# Patient Record
Sex: Female | Born: 1955 | Race: Black or African American | Hispanic: No | Marital: Married | Smoking: Never smoker
Health system: Southern US, Community
[De-identification: ages and names within clinical notes are randomized; demographics above are authoritative.]

## PROBLEM LIST (undated history)

## (undated) DIAGNOSIS — E785 Hyperlipidemia, unspecified: Secondary | ICD-10-CM

## (undated) HISTORY — PX: ABDOMINAL HYSTERECTOMY: SHX81

## (undated) HISTORY — PX: APPENDECTOMY: SHX54

## (undated) HISTORY — DX: Hyperlipidemia, unspecified: E78.5

---

## 1999-11-20 ENCOUNTER — Other Ambulatory Visit: Admission: RE | Admit: 1999-11-20 | Discharge: 1999-11-20 | Payer: Self-pay | Admitting: Obstetrics and Gynecology

## 2000-12-17 ENCOUNTER — Other Ambulatory Visit: Admission: RE | Admit: 2000-12-17 | Discharge: 2000-12-17 | Payer: Self-pay | Admitting: Obstetrics and Gynecology

## 2002-01-04 ENCOUNTER — Other Ambulatory Visit: Admission: RE | Admit: 2002-01-04 | Discharge: 2002-01-04 | Payer: Self-pay | Admitting: Obstetrics and Gynecology

## 2003-02-13 ENCOUNTER — Other Ambulatory Visit: Admission: RE | Admit: 2003-02-13 | Discharge: 2003-02-13 | Payer: Self-pay | Admitting: Obstetrics and Gynecology

## 2004-04-05 ENCOUNTER — Other Ambulatory Visit: Admission: RE | Admit: 2004-04-05 | Discharge: 2004-04-05 | Payer: Self-pay | Admitting: Obstetrics and Gynecology

## 2005-05-15 ENCOUNTER — Other Ambulatory Visit: Admission: RE | Admit: 2005-05-15 | Discharge: 2005-05-15 | Payer: Self-pay | Admitting: Obstetrics and Gynecology

## 2010-03-22 ENCOUNTER — Ambulatory Visit (HOSPITAL_COMMUNITY): Admission: RE | Admit: 2010-03-22 | Discharge: 2010-03-22 | Payer: Self-pay | Admitting: Obstetrics and Gynecology

## 2011-03-17 LAB — CBC
HCT: 40.2 % (ref 36.0–46.0)
Hemoglobin: 13.4 g/dL (ref 12.0–15.0)
MCHC: 33.3 g/dL (ref 30.0–36.0)
MCV: 94.1 fL (ref 78.0–100.0)
RDW: 13.9 % (ref 11.5–15.5)

## 2012-01-23 DIAGNOSIS — K603 Anal fistula: Secondary | ICD-10-CM | POA: Insufficient documentation

## 2012-01-23 DIAGNOSIS — K649 Unspecified hemorrhoids: Secondary | ICD-10-CM | POA: Insufficient documentation

## 2012-10-14 ENCOUNTER — Other Ambulatory Visit: Payer: Self-pay | Admitting: Obstetrics and Gynecology

## 2012-10-14 DIAGNOSIS — Z1231 Encounter for screening mammogram for malignant neoplasm of breast: Secondary | ICD-10-CM

## 2012-11-22 ENCOUNTER — Ambulatory Visit
Admission: RE | Admit: 2012-11-22 | Discharge: 2012-11-22 | Disposition: A | Discharge status: Home / Self Care | Payer: 59 | Source: Ambulatory Visit | Attending: Obstetrics and Gynecology | Admitting: Obstetrics and Gynecology

## 2012-11-22 DIAGNOSIS — Z1231 Encounter for screening mammogram for malignant neoplasm of breast: Secondary | ICD-10-CM

## 2012-11-28 ENCOUNTER — Emergency Department (HOSPITAL_COMMUNITY): Payer: 59

## 2012-11-28 ENCOUNTER — Encounter (HOSPITAL_COMMUNITY): Payer: Self-pay | Admitting: Emergency Medicine

## 2012-11-28 ENCOUNTER — Emergency Department (HOSPITAL_COMMUNITY)
Admission: EM | Admit: 2012-11-28 | Discharge: 2012-11-28 | Disposition: A | Discharge status: Home / Self Care | Payer: 59 | Attending: Emergency Medicine | Admitting: Emergency Medicine

## 2012-11-28 DIAGNOSIS — Y9389 Activity, other specified: Secondary | ICD-10-CM | POA: Insufficient documentation

## 2012-11-28 DIAGNOSIS — Y929 Unspecified place or not applicable: Secondary | ICD-10-CM | POA: Insufficient documentation

## 2012-11-28 DIAGNOSIS — R296 Repeated falls: Secondary | ICD-10-CM | POA: Insufficient documentation

## 2012-11-28 DIAGNOSIS — S20219A Contusion of unspecified front wall of thorax, initial encounter: Secondary | ICD-10-CM | POA: Insufficient documentation

## 2012-11-28 MED ORDER — IBUPROFEN 600 MG PO TABS: 600.0000 mg | ORAL_TABLET | Freq: Four times a day (QID) | ORAL | Status: DC | PRN

## 2012-11-28 MED ORDER — HYDROCODONE-ACETAMINOPHEN 5-325 MG PO TABS: 2.0000 | ORAL_TABLET | ORAL | Status: DC | PRN

## 2012-11-28 NOTE — ED Provider Notes (Signed)
History     CSN: [id number]  Arrival date & time 11/28/12  4098   First MD Initiated Contact with Patient 11/28/12 [phone]      Chief Complaint  Patient presents with  . Shoulder Injury     HPI Patient fell in the shower this morning.  She has chief complaint of pain to her right scapula and rib area associated with the scapula.  She has pain with movement of her shoulder but the pain is in her back and scapular area.  She states the shoulder itself is nontender. History reviewed. No pertinent past medical history.  Past Surgical History  Procedure Date  . Abdominal hysterectomy   . Appendectomy     History reviewed. No pertinent family history.  History  Substance Use Topics  . Smoking status: Never Smoker   . Smokeless tobacco: Not on file  . Alcohol Use: No    OB History    Grav Para Term Preterm Abortions TAB SAB Ect Mult Living                  Review of Systems All other systems reviewed and are negative Allergies  Review of patient's allergies indicates no known allergies.  Home Medications   Current Outpatient Rx  Name  Route  Sig  Dispense  Refill  . HYDROCODONE-ACETAMINOPHEN 5-325 MG PO TABS   Oral   Take 2 tablets by mouth every 4 (four) hours as needed for pain.   10 tablet   0   . IBUPROFEN 600 MG PO TABS   Oral   Take 1 tablet (600 mg total) by mouth every 6 (six) hours as needed for pain.   30 tablet   0     BP 132/70  Pulse 88  Temp 97.7 F (36.5 C) (Oral)  Resp 18  SpO2 100%  Physical Exam  Nursing note and vitals reviewed. Constitutional: She is oriented to person, place, and time. She appears well-developed and well-nourished. No distress.  HENT:  Head: Normocephalic and atraumatic.  Eyes: Pupils are equal, round, and reactive to light.  Neck: Normal range of motion.  Cardiovascular: Normal rate and intact distal pulses.   Pulmonary/Chest: No respiratory distress.  Abdominal: Normal appearance. She exhibits no distension.    Musculoskeletal:       Right shoulder: She exhibits tenderness (As indicated).       Arms: Neurological: She is alert and oriented to person, place, and time. No cranial nerve deficit.  Skin: Skin is warm and dry. No rash noted.  Psychiatric: She has a normal mood and affect. Her behavior is normal.    ED Course  Procedures (including critical care time)   Sling applied    Labs Reviewed - No data to display Dg Ribs Unilateral W/chest Right  11/28/2012  *RADIOLOGY REPORT*  Clinical Data: Fall, posterior shoulder pain  RIGHT RIBS AND CHEST - 3+ VIEW  Comparison: 03/30/2012  Findings: Four views right ribs submitted.  Cardiomediastinal silhouette is stable.  No acute infiltrate or pulmonary edema.  No right rib fracture is identified.  No diagnostic pneumothorax.  IMPRESSION: No acute disease.  No right rib fracture is identified.  No diagnostic pneumothorax.   Original Report Authenticated By: Natasha Mead, M.D.    Dg Scapula Right  11/28/2012  *RADIOLOGY REPORT*  Clinical Data: Fall, shoulder pain  RIGHT SCAPULA - 2+ VIEWS  Comparison: None.  Findings: Three views of the right scapula submitted.  No acute fracture or subluxation.  IMPRESSION:  No acute fracture or subluxation.   Original Report Authenticated By: Natasha Mead, M.D.      1. Rib contusion       MDM         Nelia Shi, MD 11/28/12 [phone]

## 2012-11-28 NOTE — Discharge Instructions (Signed)
Chest Contusion A contusion is a deep bruise. Bruises happen when an injury causes bleeding under the skin. Signs of bruising include pain, puffiness (swelling), and discolored skin. The bruise may turn blue, purple, or yellow. Pay attention to how you are doing. HOME CARE  Put ice on the injured area.  Put ice in a plastic bag.  Place a towel between the skin and the bag.  Leave the ice on for 15 to 20 minutes at a time, 3 to 4 times a day for the first 48 hours.  Rest.  Do not lift anything heavy.  Limit your activity as told by your doctor  Take 3 to 4 deep breaths every hour while awake. Hold your hand or a pillow over the sore area for support.  Breathe from the belly (abdomen).  Breathe in through the nose, as if you are smelling a flower.  Breathe out through the mouth, as if you are blowing out a candle.  Only take medicine as told by your doctor. GET HELP RIGHT AWAY IF:   You have trouble breathing or cough up thick spit (mucus).  You have chest pain that goes into the arms or jaw.  The skin is wet and pale.  You have a fever.  You feel dizzy, weak, or pass out (faint).  You cannot breathe easily.  The bruise is getting worse. MAKE SURE YOU:   Understand these instructions.  Will watch your condition.  Will get help right away if you are not doing well or get worse. Document Released: 05/26/2008 Document Revised: 03/01/2012 Document Reviewed: 05/26/2008 [facility] Patient Information 2013 [place], [place].

## 2012-11-28 NOTE — ED Notes (Signed)
Patient states that she was in the shower and fell from a standing position to right shoulder

## 2013-10-10 ENCOUNTER — Other Ambulatory Visit: Payer: Self-pay

## 2013-10-10 DIAGNOSIS — Z1231 Encounter for screening mammogram for malignant neoplasm of breast: Secondary | ICD-10-CM

## 2013-10-31 DIAGNOSIS — C541 Malignant neoplasm of endometrium: Secondary | ICD-10-CM | POA: Insufficient documentation

## 2013-12-12 ENCOUNTER — Ambulatory Visit
Admission: RE | Admit: 2013-12-12 | Discharge: 2013-12-12 | Disposition: A | Discharge status: Home / Self Care | Payer: 59 | Source: Ambulatory Visit

## 2013-12-12 DIAGNOSIS — Z1231 Encounter for screening mammogram for malignant neoplasm of breast: Secondary | ICD-10-CM

## 2013-12-26 ENCOUNTER — Ambulatory Visit: Payer: 59

## 2014-08-22 DIAGNOSIS — Z800 Family history of malignant neoplasm of digestive organs: Secondary | ICD-10-CM | POA: Insufficient documentation

## 2014-11-01 ENCOUNTER — Other Ambulatory Visit: Payer: Self-pay

## 2014-11-24 ENCOUNTER — Other Ambulatory Visit: Payer: Self-pay

## 2014-11-24 DIAGNOSIS — Z1231 Encounter for screening mammogram for malignant neoplasm of breast: Secondary | ICD-10-CM

## 2014-12-18 ENCOUNTER — Ambulatory Visit
Admission: RE | Admit: 2014-12-18 | Discharge: 2014-12-18 | Disposition: A | Discharge status: Home / Self Care | Payer: 59 | Source: Ambulatory Visit

## 2014-12-18 DIAGNOSIS — Z1231 Encounter for screening mammogram for malignant neoplasm of breast: Secondary | ICD-10-CM

## 2015-10-30 ENCOUNTER — Other Ambulatory Visit: Payer: Self-pay

## 2015-10-30 DIAGNOSIS — Z1231 Encounter for screening mammogram for malignant neoplasm of breast: Secondary | ICD-10-CM

## 2015-12-25 ENCOUNTER — Ambulatory Visit: Payer: Self-pay

## 2015-12-31 ENCOUNTER — Ambulatory Visit: Payer: Self-pay

## 2016-01-09 ENCOUNTER — Ambulatory Visit
Admission: RE | Admit: 2016-01-09 | Discharge: 2016-01-09 | Disposition: A | Discharge status: Home / Self Care | Payer: 59 | Source: Ambulatory Visit

## 2016-01-09 DIAGNOSIS — Z1231 Encounter for screening mammogram for malignant neoplasm of breast: Secondary | ICD-10-CM

## 2016-01-14 ENCOUNTER — Ambulatory Visit: Payer: Self-pay

## 2016-12-08 ENCOUNTER — Other Ambulatory Visit: Payer: Self-pay | Admitting: Obstetrics and Gynecology

## 2016-12-08 DIAGNOSIS — Z1231 Encounter for screening mammogram for malignant neoplasm of breast: Secondary | ICD-10-CM

## 2017-01-09 ENCOUNTER — Ambulatory Visit: Payer: 59

## 2017-01-15 DIAGNOSIS — Z83511 Family history of glaucoma: Secondary | ICD-10-CM | POA: Diagnosis not present

## 2017-01-15 DIAGNOSIS — H40053 Ocular hypertension, bilateral: Secondary | ICD-10-CM | POA: Diagnosis not present

## 2017-01-15 DIAGNOSIS — H40013 Open angle with borderline findings, low risk, bilateral: Secondary | ICD-10-CM | POA: Diagnosis not present

## 2017-01-23 DIAGNOSIS — Z83511 Family history of glaucoma: Secondary | ICD-10-CM | POA: Diagnosis not present

## 2017-01-23 DIAGNOSIS — H40013 Open angle with borderline findings, low risk, bilateral: Secondary | ICD-10-CM | POA: Diagnosis not present

## 2017-01-23 DIAGNOSIS — H40053 Ocular hypertension, bilateral: Secondary | ICD-10-CM | POA: Diagnosis not present

## 2017-01-27 ENCOUNTER — Ambulatory Visit
Admission: RE | Admit: 2017-01-27 | Discharge: 2017-01-27 | Disposition: A | Discharge status: Home / Self Care | Payer: 59 | Source: Ambulatory Visit | Attending: Obstetrics and Gynecology | Admitting: Obstetrics and Gynecology

## 2017-01-27 DIAGNOSIS — Z1231 Encounter for screening mammogram for malignant neoplasm of breast: Secondary | ICD-10-CM | POA: Diagnosis not present

## 2017-05-05 DIAGNOSIS — S80262A Insect bite (nonvenomous), left knee, initial encounter: Secondary | ICD-10-CM | POA: Diagnosis not present

## 2017-05-11 DIAGNOSIS — L03116 Cellulitis of left lower limb: Secondary | ICD-10-CM | POA: Diagnosis not present

## 2017-05-14 DIAGNOSIS — Z01419 Encounter for gynecological examination (general) (routine) without abnormal findings: Secondary | ICD-10-CM | POA: Diagnosis not present

## 2017-07-24 DIAGNOSIS — H40013 Open angle with borderline findings, low risk, bilateral: Secondary | ICD-10-CM | POA: Diagnosis not present

## 2017-09-21 DIAGNOSIS — R21 Rash and other nonspecific skin eruption: Secondary | ICD-10-CM | POA: Diagnosis not present

## 2017-12-25 DIAGNOSIS — H6123 Impacted cerumen, bilateral: Secondary | ICD-10-CM | POA: Diagnosis not present

## 2017-12-25 DIAGNOSIS — J0100 Acute maxillary sinusitis, unspecified: Secondary | ICD-10-CM | POA: Diagnosis not present

## 2018-01-06 ENCOUNTER — Other Ambulatory Visit: Payer: Self-pay | Admitting: Obstetrics and Gynecology

## 2018-01-06 DIAGNOSIS — Z1231 Encounter for screening mammogram for malignant neoplasm of breast: Secondary | ICD-10-CM

## 2018-01-22 DIAGNOSIS — H40013 Open angle with borderline findings, low risk, bilateral: Secondary | ICD-10-CM | POA: Diagnosis not present

## 2018-02-03 ENCOUNTER — Ambulatory Visit
Admission: RE | Admit: 2018-02-03 | Discharge: 2018-02-03 | Disposition: A | Discharge status: Home / Self Care | Payer: 59 | Source: Ambulatory Visit | Attending: Obstetrics and Gynecology | Admitting: Obstetrics and Gynecology

## 2018-02-03 DIAGNOSIS — Z1231 Encounter for screening mammogram for malignant neoplasm of breast: Secondary | ICD-10-CM

## 2018-05-19 DIAGNOSIS — H10812 Pingueculitis, left eye: Secondary | ICD-10-CM | POA: Diagnosis not present

## 2018-05-26 DIAGNOSIS — H10812 Pingueculitis, left eye: Secondary | ICD-10-CM | POA: Diagnosis not present

## 2018-06-03 DIAGNOSIS — Z806 Family history of leukemia: Secondary | ICD-10-CM | POA: Diagnosis not present

## 2018-06-03 DIAGNOSIS — M816 Localized osteoporosis [Lequesne]: Secondary | ICD-10-CM | POA: Diagnosis not present

## 2018-06-03 DIAGNOSIS — N958 Other specified menopausal and perimenopausal disorders: Secondary | ICD-10-CM | POA: Diagnosis not present

## 2018-06-03 DIAGNOSIS — Z01419 Encounter for gynecological examination (general) (routine) without abnormal findings: Secondary | ICD-10-CM | POA: Diagnosis not present

## 2018-06-03 DIAGNOSIS — Z8542 Personal history of malignant neoplasm of other parts of uterus: Secondary | ICD-10-CM | POA: Diagnosis not present

## 2018-06-03 DIAGNOSIS — Z800 Family history of malignant neoplasm of digestive organs: Secondary | ICD-10-CM | POA: Diagnosis not present

## 2018-06-03 DIAGNOSIS — Z13820 Encounter for screening for osteoporosis: Secondary | ICD-10-CM | POA: Diagnosis not present

## 2018-07-15 DIAGNOSIS — Z809 Family history of malignant neoplasm, unspecified: Secondary | ICD-10-CM | POA: Diagnosis not present

## 2018-07-15 DIAGNOSIS — M818 Other osteoporosis without current pathological fracture: Secondary | ICD-10-CM | POA: Diagnosis not present

## 2018-07-16 DIAGNOSIS — H40013 Open angle with borderline findings, low risk, bilateral: Secondary | ICD-10-CM | POA: Diagnosis not present

## 2018-12-28 DIAGNOSIS — M7671 Peroneal tendinitis, right leg: Secondary | ICD-10-CM | POA: Diagnosis not present

## 2018-12-28 DIAGNOSIS — M79671 Pain in right foot: Secondary | ICD-10-CM | POA: Diagnosis not present

## 2018-12-28 DIAGNOSIS — M7062 Trochanteric bursitis, left hip: Secondary | ICD-10-CM | POA: Diagnosis not present

## 2018-12-30 ENCOUNTER — Other Ambulatory Visit: Payer: Self-pay | Admitting: Obstetrics and Gynecology

## 2018-12-30 DIAGNOSIS — Z1231 Encounter for screening mammogram for malignant neoplasm of breast: Secondary | ICD-10-CM

## 2019-01-17 DIAGNOSIS — J0100 Acute maxillary sinusitis, unspecified: Secondary | ICD-10-CM | POA: Diagnosis not present

## 2019-01-28 DIAGNOSIS — H40013 Open angle with borderline findings, low risk, bilateral: Secondary | ICD-10-CM | POA: Diagnosis not present

## 2019-02-04 ENCOUNTER — Ambulatory Visit
Admission: RE | Admit: 2019-02-04 | Discharge: 2019-02-04 | Disposition: A | Discharge status: Home / Self Care | Payer: 59 | Source: Ambulatory Visit | Attending: Obstetrics and Gynecology | Admitting: Obstetrics and Gynecology

## 2019-02-04 DIAGNOSIS — M79671 Pain in right foot: Secondary | ICD-10-CM | POA: Diagnosis not present

## 2019-02-04 DIAGNOSIS — Z1231 Encounter for screening mammogram for malignant neoplasm of breast: Secondary | ICD-10-CM | POA: Diagnosis not present

## 2019-02-04 DIAGNOSIS — M25552 Pain in left hip: Secondary | ICD-10-CM | POA: Diagnosis not present

## 2019-02-09 DIAGNOSIS — M7062 Trochanteric bursitis, left hip: Secondary | ICD-10-CM | POA: Diagnosis not present

## 2019-02-14 DIAGNOSIS — M7062 Trochanteric bursitis, left hip: Secondary | ICD-10-CM | POA: Diagnosis not present

## 2019-02-16 DIAGNOSIS — M7062 Trochanteric bursitis, left hip: Secondary | ICD-10-CM | POA: Diagnosis not present

## 2019-02-21 DIAGNOSIS — M7062 Trochanteric bursitis, left hip: Secondary | ICD-10-CM | POA: Diagnosis not present

## 2019-02-23 DIAGNOSIS — M7062 Trochanteric bursitis, left hip: Secondary | ICD-10-CM | POA: Diagnosis not present

## 2019-03-02 DIAGNOSIS — M7062 Trochanteric bursitis, left hip: Secondary | ICD-10-CM | POA: Diagnosis not present

## 2019-03-04 DIAGNOSIS — M7062 Trochanteric bursitis, left hip: Secondary | ICD-10-CM | POA: Diagnosis not present

## 2019-03-07 DIAGNOSIS — M7062 Trochanteric bursitis, left hip: Secondary | ICD-10-CM | POA: Diagnosis not present

## 2019-03-10 DIAGNOSIS — M7062 Trochanteric bursitis, left hip: Secondary | ICD-10-CM | POA: Diagnosis not present

## 2019-03-17 DIAGNOSIS — M25552 Pain in left hip: Secondary | ICD-10-CM | POA: Diagnosis not present

## 2019-08-01 DIAGNOSIS — M810 Age-related osteoporosis without current pathological fracture: Secondary | ICD-10-CM | POA: Insufficient documentation

## 2019-12-28 ENCOUNTER — Other Ambulatory Visit: Payer: Self-pay | Admitting: Obstetrics and Gynecology

## 2019-12-28 DIAGNOSIS — Z1231 Encounter for screening mammogram for malignant neoplasm of breast: Secondary | ICD-10-CM

## 2020-01-24 IMAGING — MG DIGITAL SCREENING BILATERAL MAMMOGRAM WITH TOMO AND CAD
8 series · 8 of 24 positions shown · non-contrast
Comparison: Previous exam(s).

CLINICAL DATA: Screening.

EXAM:
DIGITAL SCREENING BILATERAL MAMMOGRAM WITH TOMO AND CAD

[L MLO synth-2D]
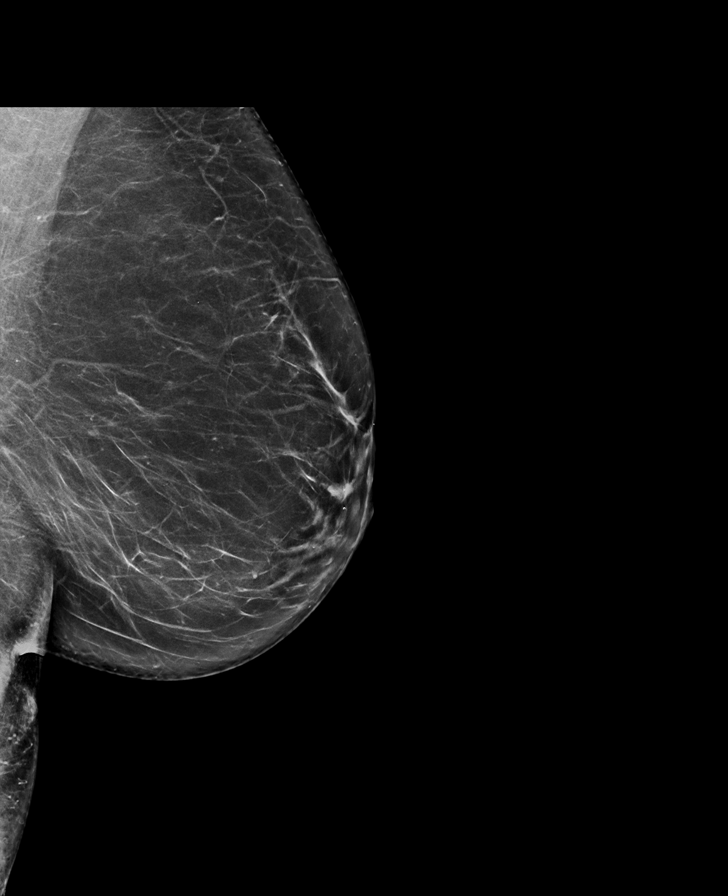

[L CC synth-2D]
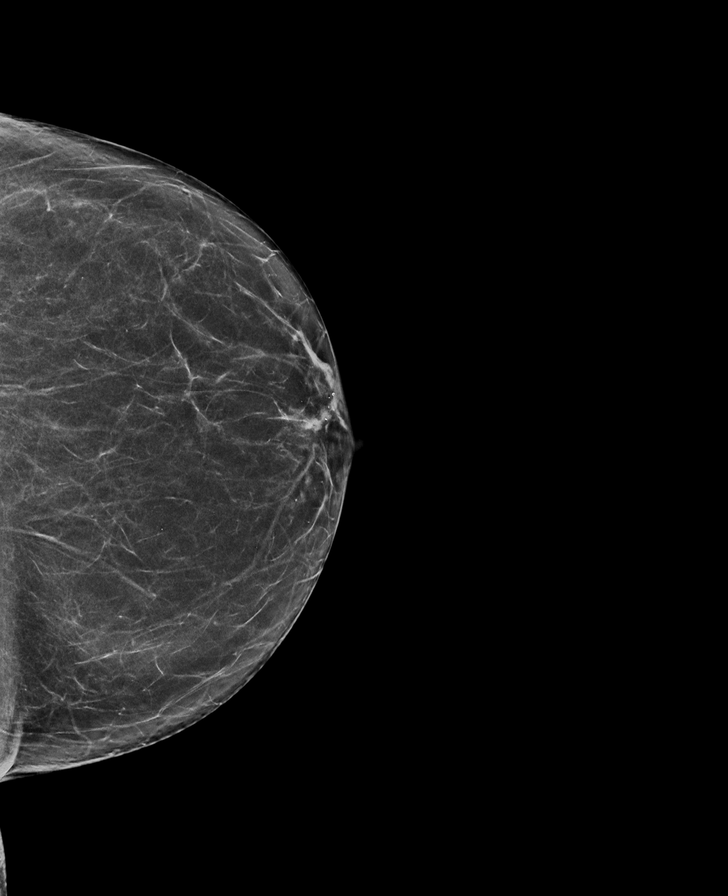

[R MLO synth-2D]
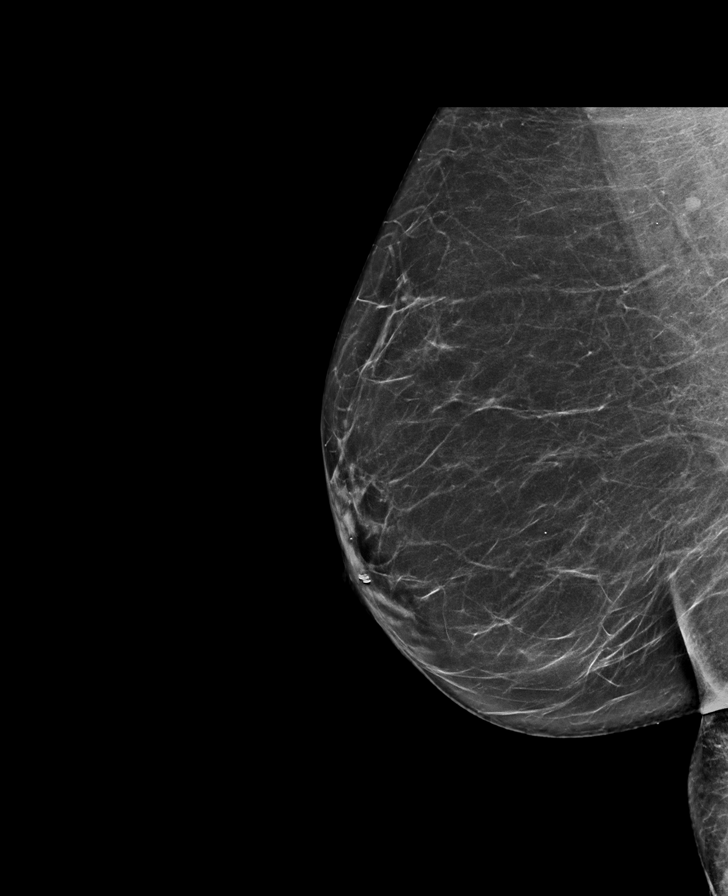

[R CC synth-2D]
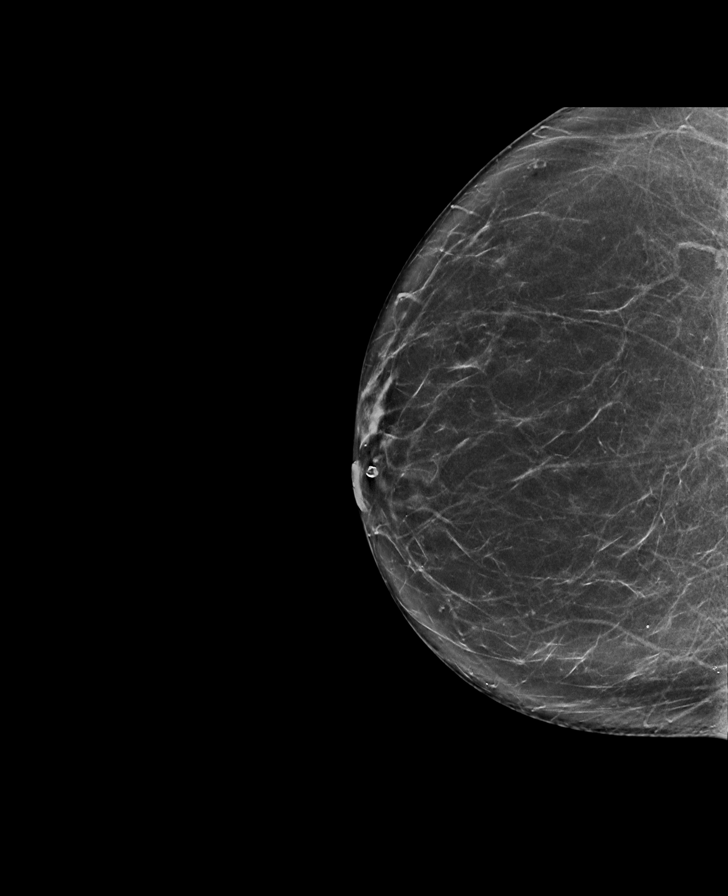

[R CC tomo · tomo slice 40/79.0]
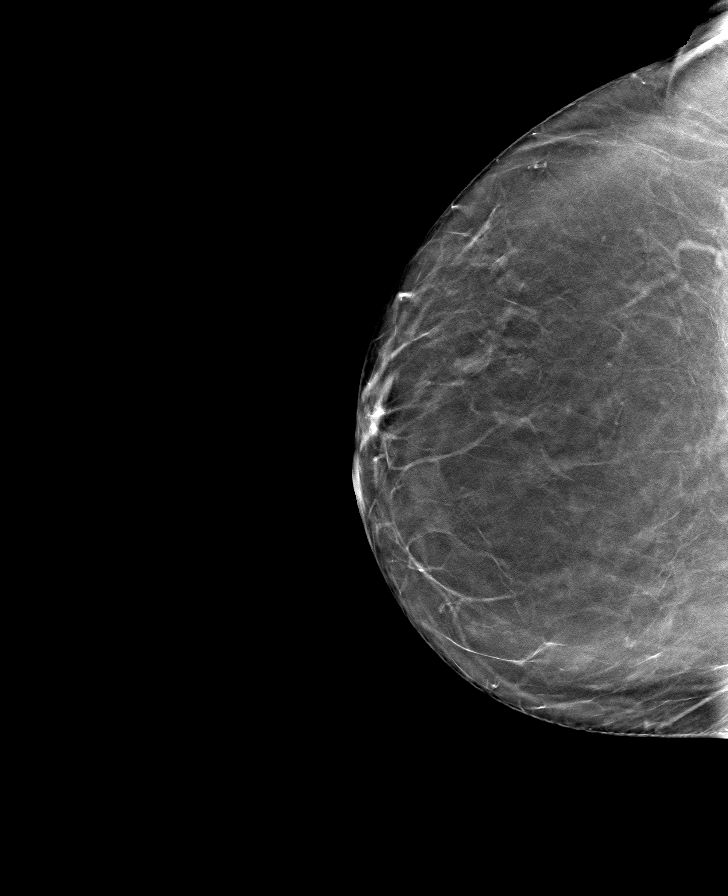

[L MLO tomo · tomo slice 40/79.0]
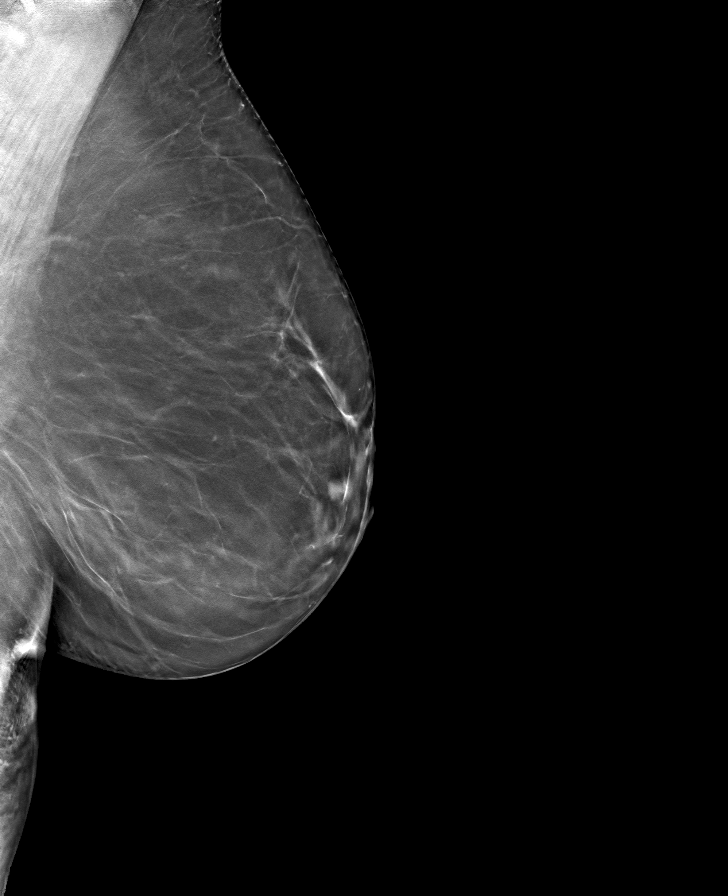

[L CC tomo · tomo slice 34/67.0]
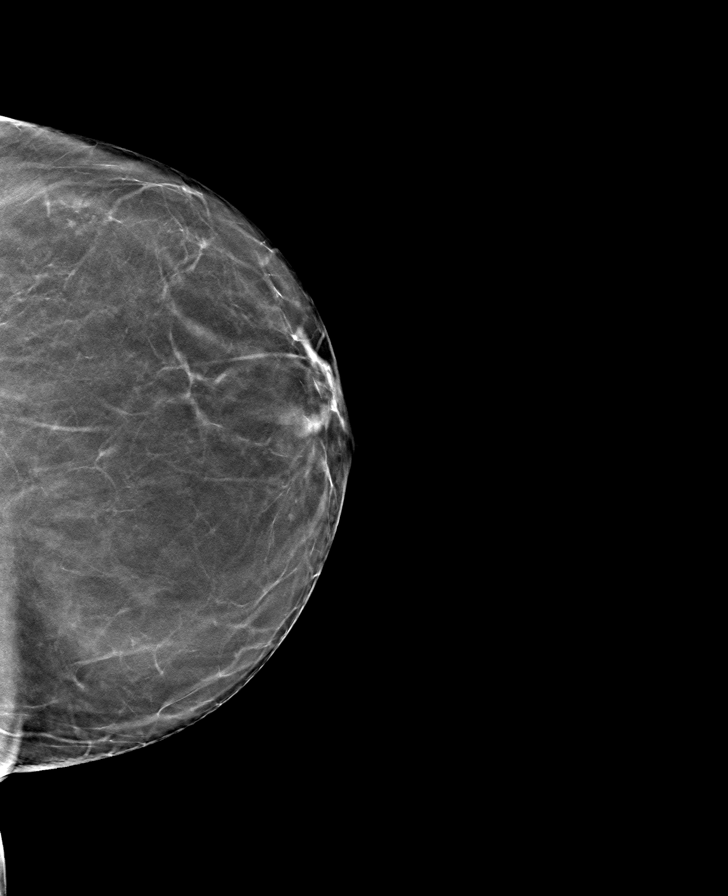

[R MLO tomo · tomo slice 37/74.0]
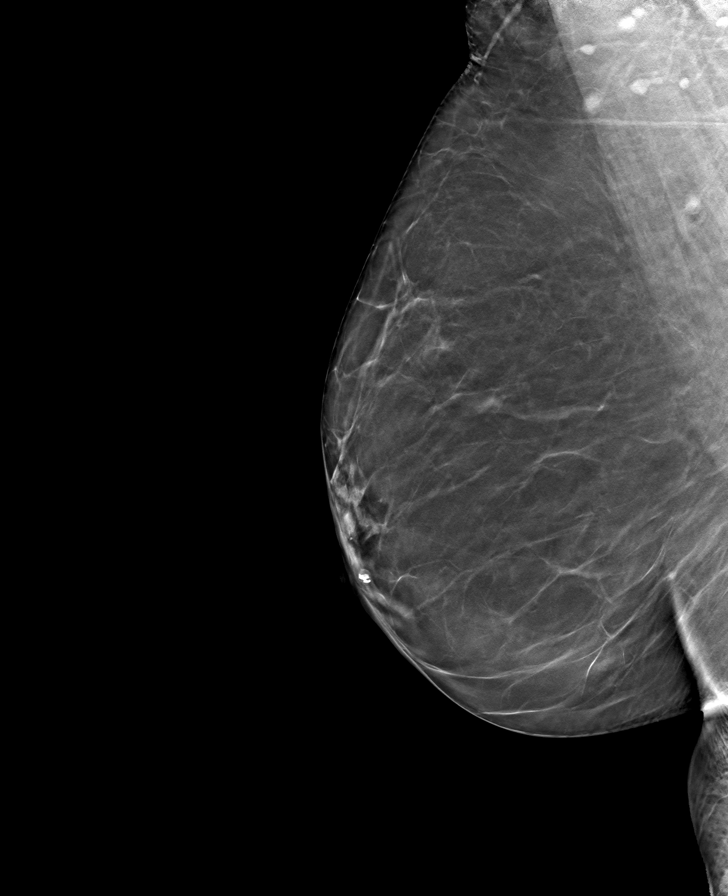

[8 of 24 positions shown; findings below may reference images not displayed]

ACR Breast Density Category b: There are scattered areas of
fibroglandular density.
FINDINGS: There are no findings suspicious for malignancy. Images were
processed with CAD.
IMPRESSION: No mammographic evidence of malignancy. A result letter of this
screening mammogram will be mailed directly to the patient.

RECOMMENDATION:
Screening mammogram in one year. (Code:[id number])

BI-RADS CATEGORY  1: Negative.

## 2020-02-06 ENCOUNTER — Ambulatory Visit: Payer: 59

## 2020-02-17 ENCOUNTER — Other Ambulatory Visit: Payer: Self-pay

## 2020-02-17 ENCOUNTER — Ambulatory Visit
Admission: RE | Admit: 2020-02-17 | Discharge: 2020-02-17 | Disposition: A | Discharge status: Home / Self Care | Payer: 59 | Source: Ambulatory Visit | Attending: Obstetrics and Gynecology | Admitting: Obstetrics and Gynecology

## 2020-02-17 DIAGNOSIS — Z1231 Encounter for screening mammogram for malignant neoplasm of breast: Secondary | ICD-10-CM

## 2020-09-05 ENCOUNTER — Ambulatory Visit: Payer: Self-pay | Admitting: Podiatry

## 2020-09-24 ENCOUNTER — Ambulatory Visit (INDEPENDENT_AMBULATORY_CARE_PROVIDER_SITE_OTHER): Payer: Medicare Other

## 2020-09-24 ENCOUNTER — Ambulatory Visit (INDEPENDENT_AMBULATORY_CARE_PROVIDER_SITE_OTHER): Payer: Medicare Other | Admitting: Podiatry

## 2020-09-24 ENCOUNTER — Other Ambulatory Visit: Payer: Self-pay

## 2020-09-24 ENCOUNTER — Encounter: Payer: Self-pay | Admitting: Podiatry

## 2020-09-24 DIAGNOSIS — M778 Other enthesopathies, not elsewhere classified: Secondary | ICD-10-CM

## 2020-09-24 DIAGNOSIS — M779 Enthesopathy, unspecified: Secondary | ICD-10-CM | POA: Diagnosis not present

## 2020-09-24 DIAGNOSIS — M205X2 Other deformities of toe(s) (acquired), left foot: Secondary | ICD-10-CM

## 2020-09-24 NOTE — Progress Notes (Signed)
Subjective:   Patient ID: Lisa Santos, female   DOB: 64 y.o.   MRN: [id number]   HPI Patient presents stating she has been taking care of her husband has developed increased foot pain left over right and states it hurts on the top and on the side.  Also complains about her big toe joint left stating that is been getting increasingly sore and patient does not smoke likes to be active   Review of Systems  All other systems reviewed and are negative.       Objective:  Physical Exam Vitals and nursing note reviewed.  Constitutional:      Appearance: She is well-developed.  Pulmonary:     Effort: Pulmonary effort is normal.  Musculoskeletal:        General: Normal range of motion.  Skin:    General: Skin is warm.  Neurological:     Mental Status: She is alert.     Neurovascular status was found to be intact muscle strength was found to be adequate range of motion within normal limits.  Patient has exquisite discomfort in the extensor tendon complex left with inflammation of the midtarsal joint and mild in the lateral peroneal group.  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  Mild peroneal tendinitis also      Plan:  H&P reviewed conditions.  We will get a focus on the dorsal tendinitis and I did do sterile prep and injected the tendon complex 3 mg Kenalog 5 mg Xylocaine I discussed the hallux limitus and we discussed the possibility for spur removal and possible osteotomy.  I will make a decision based on response and how much is bothering her and she is taking care of  her husband has had double knee replacements so we may have to defer this to the future  X-rays indicate spurring of the first metatarsal head left with elevated first metatarsal segment with moderate arthritis midtarsal joint left

## 2020-10-10 ENCOUNTER — Encounter: Payer: Self-pay | Admitting: Podiatry

## 2020-10-10 ENCOUNTER — Other Ambulatory Visit: Payer: Self-pay

## 2020-10-10 ENCOUNTER — Ambulatory Visit (INDEPENDENT_AMBULATORY_CARE_PROVIDER_SITE_OTHER): Payer: Medicare Other | Admitting: Podiatry

## 2020-10-10 DIAGNOSIS — M778 Other enthesopathies, not elsewhere classified: Secondary | ICD-10-CM

## 2020-10-10 DIAGNOSIS — M205X2 Other deformities of toe(s) (acquired), left foot: Secondary | ICD-10-CM

## 2020-10-10 DIAGNOSIS — M7672 Peroneal tendinitis, left leg: Secondary | ICD-10-CM

## 2020-10-10 NOTE — Progress Notes (Signed)
Subjective:   Patient ID: Lisa Santos, female   DOB: 64 y.o.   MRN: [id number]   HPI Patient presents stating that she is getting pain on the top of her right foot and the side of her left foot and states brought she is wondering about the bunions at the loss of joint motion whether that needs to be fixed   ROS      Objective:  Physical Exam  Neurovascular status intact with inflammation pain around the fifth metatarsal base left and the medial aspect dorsal extensor complex as it comes across the midfoot right with reduction of motion first MPJ left over right with pain in the left big toe joint     Assessment:  Inflammatory tendinitis bilateral peroneal left extensor right with hallux limitus deformity left over right     Plan:  H&P reviewed both conditions.  At this point I want to continue to focus on the acute inflammation I injected the extensor complex right 3 mg dexamethasone Kenalog and for the left I injected the peroneal insertion 5 mg Xylocaine Marcaine and 5 mg dexamethasone Kenalog and discussed hallux limitus and she will flared up prior to me seeing her next week so I can reevaluate

## 2020-11-21 ENCOUNTER — Encounter: Payer: Self-pay | Admitting: Podiatry

## 2020-11-21 ENCOUNTER — Other Ambulatory Visit: Payer: Self-pay

## 2020-11-21 ENCOUNTER — Ambulatory Visit (INDEPENDENT_AMBULATORY_CARE_PROVIDER_SITE_OTHER): Payer: Medicare Other | Admitting: Podiatry

## 2020-11-21 DIAGNOSIS — M778 Other enthesopathies, not elsewhere classified: Secondary | ICD-10-CM

## 2020-11-21 DIAGNOSIS — M205X2 Other deformities of toe(s) (acquired), left foot: Secondary | ICD-10-CM

## 2020-11-21 DIAGNOSIS — M7672 Peroneal tendinitis, left leg: Secondary | ICD-10-CM | POA: Diagnosis not present

## 2020-11-21 DIAGNOSIS — M7750 Other enthesopathy of unspecified foot: Secondary | ICD-10-CM | POA: Insufficient documentation

## 2020-11-21 NOTE — Progress Notes (Signed)
Subjective:   Patient ID: Lisa Santos, female   DOB: 64 y.o.   MRN: [id number]   HPI Patient states that she is currently improved after having treatment and states that she is walking with a better heel toe gait.  Patient does have some discomfort in the big toe joint and wants to discuss   ROS      Objective:  Physical Exam  Neurovascular status intact with discomfort of the lateral side foot improved plantar fascial irritation present but somewhat improved with patient having reduction of motion first MPJ left over right that is moderately tender when pressed.  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  Spent a great deal of time explaining hallux rigidus going over treatment options consideration for osteotomy or eventual fusion or implantation procedure.  Patient is discharged will be seen back as needed and was encouraged to utilize topical medicines oral medicines as needed and ice and physical therapy

## 2021-02-19 ENCOUNTER — Other Ambulatory Visit: Payer: Self-pay | Admitting: Obstetrics and Gynecology

## 2021-02-19 DIAGNOSIS — Z1231 Encounter for screening mammogram for malignant neoplasm of breast: Secondary | ICD-10-CM

## 2021-04-01 ENCOUNTER — Ambulatory Visit
Admission: RE | Admit: 2021-04-01 | Discharge: 2021-04-01 | Disposition: A | Discharge status: Home / Self Care | Payer: Self-pay | Source: Ambulatory Visit | Attending: Obstetrics and Gynecology | Admitting: Obstetrics and Gynecology

## 2021-04-01 ENCOUNTER — Other Ambulatory Visit: Payer: Self-pay

## 2021-04-01 DIAGNOSIS — Z1231 Encounter for screening mammogram for malignant neoplasm of breast: Secondary | ICD-10-CM

## 2021-04-18 ENCOUNTER — Other Ambulatory Visit: Payer: Self-pay

## 2021-04-18 ENCOUNTER — Ambulatory Visit (INDEPENDENT_AMBULATORY_CARE_PROVIDER_SITE_OTHER): Payer: Medicare Other

## 2021-04-18 ENCOUNTER — Ambulatory Visit (INDEPENDENT_AMBULATORY_CARE_PROVIDER_SITE_OTHER): Payer: Medicare Other | Admitting: Podiatry

## 2021-04-18 DIAGNOSIS — M7672 Peroneal tendinitis, left leg: Secondary | ICD-10-CM

## 2021-04-18 DIAGNOSIS — M778 Other enthesopathies, not elsewhere classified: Secondary | ICD-10-CM

## 2021-04-18 DIAGNOSIS — M205X2 Other deformities of toe(s) (acquired), left foot: Secondary | ICD-10-CM

## 2021-04-18 MED ORDER — TRIAMCINOLONE ACETONIDE 10 MG/ML IJ SUSP
10.0000 mg | Freq: Once | INTRAMUSCULAR | Status: AC
  Administered 2021-04-18: 10 mg

## 2021-04-18 NOTE — Progress Notes (Signed)
Subjective:   Patient ID: Lisa Santos, female   DOB: 65 y.o.   MRN: [id number]   HPI Patient states she is having a lot of pain again in the outside of her left foot and her big toe joint continues to be a problem with pain and compensation in her gait.  States that she feels like the bump is also grown in size   ROS      Objective:  Physical Exam  Neurovascular status intact with significant loss of motion first MPJ left no crepitus of the joint dorsal spur formation and inflammation of the peroneal base fifth metatarsal left with fluid buildup and soreness at the insertion tendon with no indication of tendon dysfunction     Assessment:  Hallux limitus rigidus deformity left that is gradually progressing with inflammation pain along with probability for peroneal tendinitis that also has a compensatory mechanism associated with     Plan:  H&P reviewed condition.  At this point I did explain the relationship of the 2 conditions discussed the structural deformity the first MPJ with large spur formation and patient is opted for surgical intervention.  I did discuss biplanar type osteotomy and explained that there is no guarantee that this will solve the problem and possibility exists long-term for fusion or implantation procedure.  She is willing to accept risk want surgery was given consent form reviewing alternative treatments complications and signed consent form.  Patient understands total recovery can take 6 months and also had air fracture walker dispensed with injection that occurred at the base of the fifth metatarsal today 3 mg Dexasone Kenalog 5 mg Xylocaine and she can start wearing the boot now to reduce inflammation and will use it postoperatively  X-rays indicate there is significant spur of the first metatarsal head left with elevation of the first metatarsal component and moderate narrowness of the joint surface

## 2021-04-19 ENCOUNTER — Ambulatory Visit: Payer: 59

## 2021-05-15 ENCOUNTER — Telehealth: Payer: Self-pay | Admitting: Urology

## 2021-05-15 NOTE — Telephone Encounter (Signed)
DOS - 06/04/21   [place] BUNIONECTOMY LEFT --- 19509   [employer] EFFECTIVE DATE - 12/22/20   PLAN DEDUCTIBLE -  $200.00 W/ $0.00 REMAINING  OUT OF POCKET - $2,200.00 W/ $2,000.00 REMAINING COINSURANCE - 0% COPAY -  $0.00  PER [employer] WEBSITE FOR CPT [id number] Notification or Prior Authorization is not required for the requested services   Decision ID #:[id number]

## 2021-06-03 MED ORDER — OXYCODONE-ACETAMINOPHEN 10-325 MG PO TABS: 1.0000 | ORAL_TABLET | ORAL | 0 refills | Status: DC | PRN

## 2021-06-03 MED ORDER — ONDANSETRON HCL 4 MG PO TABS: 4.0000 mg | ORAL_TABLET | Freq: Three times a day (TID) | ORAL | 0 refills | Status: DC | PRN

## 2021-06-03 NOTE — Addendum Note (Signed)
Addended by: Wallene Huh on: 06/03/2021 05:17 PM   Modules accepted: Orders

## 2021-06-04 ENCOUNTER — Encounter: Payer: Self-pay | Admitting: Podiatry

## 2021-06-04 DIAGNOSIS — M2022 Hallux rigidus, left foot: Secondary | ICD-10-CM | POA: Diagnosis not present

## 2021-06-10 ENCOUNTER — Other Ambulatory Visit: Payer: Self-pay

## 2021-06-10 ENCOUNTER — Ambulatory Visit (INDEPENDENT_AMBULATORY_CARE_PROVIDER_SITE_OTHER): Payer: Medicare Other | Admitting: Podiatry

## 2021-06-10 ENCOUNTER — Encounter: Payer: Self-pay | Admitting: Podiatry

## 2021-06-10 ENCOUNTER — Ambulatory Visit (INDEPENDENT_AMBULATORY_CARE_PROVIDER_SITE_OTHER): Payer: Medicare Other

## 2021-06-10 DIAGNOSIS — M205X2 Other deformities of toe(s) (acquired), left foot: Secondary | ICD-10-CM

## 2021-06-10 NOTE — Progress Notes (Signed)
Subjective:   Patient ID: Lisa Santos, female   DOB: 65 y.o.   MRN: [id number]   HPI Patient states she is doing very well very pleased so far with surgery and walking with a normal heel toe gait and concerned somewhat about mild rash on the back of the left lower leg   ROS      Objective:  Physical Exam  Neurovascular status intact negative Bevelyn Buckles' sign noted wound edges first MPJ healing well wound edges well coapted mild restriction of motion first MPJ no crepitus     Assessment:  Doing well overall with patient found to have moderate range of motion loss first MPJ that needs to be worked on with stretch     Plan:  H&P x-ray reviewed advised her on range of motion exercises and using boot surgical shoe.  Explained we may do physical therapy but I will wait till I reevaluate her and during that time she will continue exercises and at this time surgical shoe ankle compression dispensed  X-rays indicate osteotomies healing well joint is rounded good position and has been properly lowered

## 2021-06-11 ENCOUNTER — Encounter: Payer: Self-pay | Admitting: Podiatry

## 2021-06-12 ENCOUNTER — Telehealth: Payer: Self-pay | Admitting: *Deleted

## 2021-06-12 NOTE — Telephone Encounter (Signed)
Patient is calling because she is having a little bleeding from surgical site this morning. She applied a small gauze to the area and re wrapped with the ace bandage. She wanted to make sure that this was ok since there was no gauze applied in the office. Informed the patient that was ok and to loosely re wrap with the ace wrap, elevate and continue to ice.if the bleeding becomes worse to call our office back asap.She verbalized understanding.

## 2021-07-08 ENCOUNTER — Ambulatory Visit (INDEPENDENT_AMBULATORY_CARE_PROVIDER_SITE_OTHER): Payer: Medicare Other | Admitting: Podiatry

## 2021-07-08 ENCOUNTER — Other Ambulatory Visit: Payer: Self-pay

## 2021-07-08 DIAGNOSIS — M205X2 Other deformities of toe(s) (acquired), left foot: Secondary | ICD-10-CM

## 2021-07-08 NOTE — Progress Notes (Signed)
Subjective:   Patient ID: Lisa Santos, female   DOB: 65 y.o.   MRN: [id number]   HPI Patient presents stating doing well with surgery very pleased but have not been moving my big toe joint as I was supposed   ROS      Objective:  Physical Exam  Neurovascular status intact negative Bevelyn Buckles' sign noted left foot healing well wound edges well coapted range of motion adequate no crepitus of the joint somewhat reduced range of motion noted     Assessment:  Doing well overall needs to work on range of motion first MPJ     Plan:  Reviewed condition and expressed the importance of range of motion and I will send her for physical therapy and dispensed ankle compression stocking to keep swelling down  X-rays indicate osteotomies doing well patient to be seen back recheck 6 weeks or earlier if needed

## 2021-08-28 ENCOUNTER — Encounter: Payer: Medicare Other | Admitting: Podiatry

## 2021-09-02 DIAGNOSIS — E7800 Pure hypercholesterolemia, unspecified: Secondary | ICD-10-CM | POA: Insufficient documentation

## 2021-09-04 ENCOUNTER — Encounter: Payer: Self-pay | Admitting: Podiatry

## 2021-09-04 ENCOUNTER — Ambulatory Visit (INDEPENDENT_AMBULATORY_CARE_PROVIDER_SITE_OTHER): Payer: Medicare Other

## 2021-09-04 ENCOUNTER — Other Ambulatory Visit: Payer: Self-pay

## 2021-09-04 ENCOUNTER — Ambulatory Visit (INDEPENDENT_AMBULATORY_CARE_PROVIDER_SITE_OTHER): Payer: Medicare Other | Admitting: Podiatry

## 2021-09-04 ENCOUNTER — Encounter: Payer: Medicare Other | Admitting: Podiatry

## 2021-09-04 DIAGNOSIS — M205X2 Other deformities of toe(s) (acquired), left foot: Secondary | ICD-10-CM

## 2021-09-05 ENCOUNTER — Encounter: Payer: Medicare Other | Admitting: Podiatry

## 2021-09-05 NOTE — Progress Notes (Signed)
Subjective:   Patient ID: Lisa Santos, female   DOB: 65 y.o.   MRN: [id number]   HPI Patient presents stating that doing well and is very pleased with results feels like motion is improving with physical therapy   ROS      Objective:  Physical Exam  Neuro vascular status intact negative Bevelyn Buckles' sign noted left first MPJ shows reasonably good motion with slight still restriction but no crepitus within the joint     Assessment:  Overall doing well after having had first metatarsal osteotomy left     Plan:  Reviewed condition and recommended continuation of conservative treatment and the importance of walking on her entire foot.  Bone is healed she may resume normal activities  X-rays indicate osteotomies healing well fixation in place joint congruence

## 2021-10-30 ENCOUNTER — Encounter: Payer: Self-pay | Admitting: Podiatry

## 2021-10-30 ENCOUNTER — Ambulatory Visit: Payer: Medicare Other

## 2021-10-30 ENCOUNTER — Ambulatory Visit (INDEPENDENT_AMBULATORY_CARE_PROVIDER_SITE_OTHER): Payer: Medicare Other | Admitting: Podiatry

## 2021-10-30 ENCOUNTER — Other Ambulatory Visit: Payer: Self-pay

## 2021-10-30 DIAGNOSIS — M205X2 Other deformities of toe(s) (acquired), left foot: Secondary | ICD-10-CM

## 2021-10-30 DIAGNOSIS — M7672 Peroneal tendinitis, left leg: Secondary | ICD-10-CM | POA: Diagnosis not present

## 2021-10-30 MED ORDER — TRIAMCINOLONE ACETONIDE 10 MG/ML IJ SUSP
10.0000 mg | Freq: Once | INTRAMUSCULAR | Status: AC
  Administered 2021-10-30: 10 mg

## 2021-10-30 NOTE — Progress Notes (Signed)
Subjective:   Patient ID: Lisa Santos, female   DOB: 65 y.o.   MRN: [id number]   HPI Patient states she is doing really well with the big toe joint left very pleased with the surgery and walking better but is getting quite a bit of discomfort in the outside of the left foot which is been present for around a month.  States that she is been trying to change her gait with the surgery but still cannot walk perfectly normal   ROS      Objective:  Physical Exam  Neurovascular status intact with inflammation pain of the peroneal base fifth metatarsal left with fluid buildup around the insertional point no muscle strength loss with good range of motion first MPJ no crepitus of the joint     Assessment:  Inflammatory peroneal tendinitis left with fluid buildup along with good range of motion first MPJ left     Plan:  H&P reviewed condition and at this point I did sterile prep and injected the tendon insertion the left base of fifth metatarsal 3 mg Dexasone Kenalog 5 mg Xylocai after first discussing more chances for rupture with any type of injection.  I did immobilize with a fascial brace to lift up the lateral side of the foot and discussed boot usage and if symptoms persist will have to consider other types of treatment  X-rays indicate the osteotomy first left is healed there is some slight irregularity of the joint which is probably due to preoperative arthritis but overall looks good and no indications of pathology or issues left lateral foot

## 2022-01-27 ENCOUNTER — Other Ambulatory Visit: Payer: Self-pay

## 2022-01-27 DIAGNOSIS — I739 Peripheral vascular disease, unspecified: Secondary | ICD-10-CM

## 2022-01-27 NOTE — Progress Notes (Signed)
[facility]  ASSESSMENT / PLAN: Lisa Santos is a 66 y.o. female with abnormal screening test, concerning for peripheral arterial disease.  She has no symptoms of peripheral arterial disease on history.  Her physical exam is reassuring, with palpable pedal pulses.  Her noninvasive testing is reassuring.  Her ankle-brachial index is normal.  Her toe pressure is abnormal.  I reassured her about these findings.  I encouraged her to continue to take care of herself and be as active as possible.  She can follow-up with me as needed.  CHIEF COMPLAINT: Abnormal home PAD screening test  HISTORY OF PRESENT ILLNESS: Lisa Santos is a 66 y.o. female referred to clinic for evaluation of abnormal home PAD screening test.  She presented to her primary care physician after home health nurse identified an abnormal toe pressure.  Her primary care physician referred her for further evaluation to help set her mind to these.  The patient has no symptoms of intermittent claudication, ischemic rest pain she has no ulcers about her feet.  She does have trouble orthopedically with her feet.  She has undergone excision of a bone spur, and is currently suffering from plantar fasciitis.  She is not limited in her ability to walk except in regards to pain at the plantar fasciitis.  Past Medical History:  Diagnosis Date   Hyperlipidemia     Past Surgical History:  Procedure Laterality Date   ABDOMINAL HYSTERECTOMY     APPENDECTOMY      Family History  Problem Relation Age of Onset   Breast cancer Neg Hx     Social History   Socioeconomic History   Marital status: Married    Spouse name: Not on file   Number of children: Not on file   Years of education: Not on file   Highest education level: Not on file  Occupational History   Not on file  Tobacco Use   Smoking status: Never   Smokeless tobacco: Not on file  Vaping Use   Vaping Use: Never used  Substance and Sexual  Activity   Alcohol use: No   Drug use: No   Sexual activity: Not on file  Other Topics Concern   Not on file  Social History Narrative   Not on file   Social Determinants of Health   Financial Resource Strain: Not on file  Food Insecurity: Not on file  Transportation Needs: Not on file  Physical Activity: Not on file  Stress: Not on file  Social Connections: Not on file  Intimate Partner Violence: Not on file    Allergies  Allergen Reactions   Ibandronic Acid     Other reaction(s): reflux   Oxycodone     Other reaction(s): stomach upset and loopy   Latex Rash    Current Outpatient Medications  Medication Sig Dispense Refill   rosuvastatin (CRESTOR) 10 MG tablet rosuvastatin 10 mg tablet   1 tablet every day by oral route.     pantoprazole (PROTONIX) 40 MG tablet Take 40 mg by mouth daily. (Patient not taking: Reported on 01/28/2022)     No current facility-administered medications for this visit.    REVIEW OF SYSTEMS:  [X]  denotes positive finding, [ ]  denotes negative finding Cardiac  Comments:  Chest pain or chest pressure:    Shortness of breath upon exertion:    Short of breath when lying flat:    Irregular heart rhythm:        Vascular  Pain in calf, thigh, or hip brought on by ambulation:    Pain in feet at night that wakes you up from your sleep:     Blood clot in your veins:    Leg swelling:         Pulmonary    Oxygen at home:    Productive cough:     Wheezing:         Neurologic    Sudden weakness in arms or legs:     Sudden numbness in arms or legs:     Sudden onset of difficulty speaking or slurred speech:    Temporary loss of vision in one eye:     Problems with dizziness:         Gastrointestinal    Blood in stool:     Vomited blood:         Genitourinary    Burning when urinating:     Blood in urine:        Psychiatric    Major depression:         Hematologic    Bleeding problems:    Problems with blood clotting too easily:         Skin    Rashes or ulcers:        Constitutional    Fever or chills:      PHYSICAL EXAM Vitals:   01/28/22 1056  BP: 126/74  Pulse: 60  Resp: 18  Temp: 98.3 F (36.8 C)  SpO2: 100%  Weight: 168 lb (76.2 kg)  Height: 5\' 3"  (1.6 m)   Constitutional: well appearing in no distress. Appears well nourished.  Neurologic: CN intact.  No focal findings.  Psychiatric: Mood and affect symmetric and appropriate. Eyes: No icterus. No conjunctival pallor. Ears, nose, throat: mucous membranes moist. Midline trachea.  Cardiac: Regular rate and rhythm.  Respiratory: unlabored. Abdominal: soft, non-tender, non-distended.  Peripheral vascular:  2+ posterior tibial pulses bilaterally.  1+ dorsalis pedis pulses bilaterally Extremity: No edema. No cyanosis. No pallor.  Skin: No gangrene. No ulceration.  Lymphatic: No Stemmer's sign. No palpable lymphadenopathy.   PERTINENT LABORATORY AND RADIOLOGIC DATA  Most recent CBC CBC Latest Ref Rng & Units 03/21/2010  WBC 4.0 - 10.5 K/uL 4.0  Hemoglobin 12.0 - 15.0 g/dL 13.4  Hematocrit 36.0 - 46.0 % 40.2  Platelets 150 - 400 K/uL 327     +-------+-----------+-----------+------------+------------+   ABI/TBI Today's ABI Today's TBI Previous ABI Previous TBI   +-------+-----------+-----------+------------+------------+   Right   1.20        0.78                                    +-------+-----------+-----------+------------+------------+   Left    1.18        0.66                                    +-------+-----------+-----------+------------+------------+   Yevonne Aline. Stanford Breed, MD [facility] Phone Number: [phone] 01/28/2022 12:22 PM  Total time spent on preparing this encounter including chart review, data review, collecting history, examining the patient, coordinating care for this new patient, 45 minutes.  Portions of this report may have been transcribed using voice recognition software.   Every effort has been made to ensure accuracy; however, inadvertent computerized transcription  errors may still be present.

## 2022-01-28 ENCOUNTER — Other Ambulatory Visit: Payer: Self-pay

## 2022-01-28 ENCOUNTER — Ambulatory Visit (INDEPENDENT_AMBULATORY_CARE_PROVIDER_SITE_OTHER): Payer: Medicare Other | Admitting: Vascular Surgery

## 2022-01-28 ENCOUNTER — Encounter: Payer: Self-pay | Admitting: Vascular Surgery

## 2022-01-28 ENCOUNTER — Ambulatory Visit (HOSPITAL_COMMUNITY)
Admission: RE | Admit: 2022-01-28 | Discharge: 2022-01-28 | Disposition: A | Discharge status: Home / Self Care | Payer: Medicare Other | Source: Ambulatory Visit | Attending: Vascular Surgery | Admitting: Vascular Surgery

## 2022-01-28 VITALS — BP 126/74 | HR 60 | Temp 98.3°F | Resp 18 | Ht 63.0 in | Wt 168.0 lb

## 2022-01-28 DIAGNOSIS — R6889 Other general symptoms and signs: Secondary | ICD-10-CM | POA: Diagnosis not present

## 2022-01-28 DIAGNOSIS — I739 Peripheral vascular disease, unspecified: Secondary | ICD-10-CM | POA: Diagnosis present

## 2022-02-24 ENCOUNTER — Other Ambulatory Visit: Payer: Self-pay | Admitting: Obstetrics and Gynecology

## 2022-02-24 DIAGNOSIS — Z1231 Encounter for screening mammogram for malignant neoplasm of breast: Secondary | ICD-10-CM

## 2022-04-02 ENCOUNTER — Ambulatory Visit
Admission: RE | Admit: 2022-04-02 | Discharge: 2022-04-02 | Disposition: A | Discharge status: Home / Self Care | Payer: Medicare Other | Source: Ambulatory Visit | Attending: Obstetrics and Gynecology | Admitting: Obstetrics and Gynecology

## 2022-04-02 DIAGNOSIS — Z1231 Encounter for screening mammogram for malignant neoplasm of breast: Secondary | ICD-10-CM

## 2023-02-18 ENCOUNTER — Other Ambulatory Visit: Payer: Self-pay | Admitting: Obstetrics and Gynecology

## 2023-02-18 DIAGNOSIS — Z1231 Encounter for screening mammogram for malignant neoplasm of breast: Secondary | ICD-10-CM

## 2023-04-08 ENCOUNTER — Ambulatory Visit
Admission: RE | Admit: 2023-04-08 | Discharge: 2023-04-08 | Disposition: A | Discharge status: Home / Self Care | Payer: Medicare Other | Source: Ambulatory Visit | Attending: Obstetrics and Gynecology | Admitting: Obstetrics and Gynecology

## 2023-04-08 DIAGNOSIS — Z1231 Encounter for screening mammogram for malignant neoplasm of breast: Secondary | ICD-10-CM

## 2023-08-28 ENCOUNTER — Other Ambulatory Visit: Payer: Self-pay

## 2023-08-28 ENCOUNTER — Encounter: Payer: Self-pay | Admitting: Internal Medicine

## 2023-08-28 ENCOUNTER — Ambulatory Visit (INDEPENDENT_AMBULATORY_CARE_PROVIDER_SITE_OTHER): Payer: Medicare Other | Admitting: Internal Medicine

## 2023-08-28 VITALS — BP 134/80 | HR 76 | Temp 98.0°F | Resp 18 | Ht 62.6 in | Wt 172.5 lb

## 2023-08-28 DIAGNOSIS — J310 Chronic rhinitis: Secondary | ICD-10-CM

## 2023-08-28 DIAGNOSIS — L503 Dermatographic urticaria: Secondary | ICD-10-CM | POA: Diagnosis not present

## 2023-08-28 DIAGNOSIS — H1045 Other chronic allergic conjunctivitis: Secondary | ICD-10-CM

## 2023-08-28 MED ORDER — OLOPATADINE HCL 0.2 % OP SOLN: 1.0000 [drp] | OPHTHALMIC | 5 refills | Status: AC

## 2023-08-28 MED ORDER — LEVOCETIRIZINE DIHYDROCHLORIDE 5 MG PO TABS: 5.0000 mg | ORAL_TABLET | Freq: Every evening | ORAL | 5 refills | Status: AC

## 2023-08-28 MED ORDER — AZELASTINE HCL 0.1 % NA SOLN: 2.0000 | Freq: Two times a day (BID) | NASAL | 12 refills | Status: DC

## 2023-08-28 MED ORDER — FLUTICASONE PROPIONATE 50 MCG/ACT NA SUSP: 2.0000 | Freq: Every day | NASAL | 2 refills | Status: DC

## 2023-08-28 NOTE — Patient Instructions (Signed)
Chronic Rhinitis    : Chronic Pruritus and Dermatographism  - allergy testing today: deferred due to [facility] insurance   - Prevention:  - allergen avoidance when possible - consider allergy shots as long term control of your symptoms by teaching your immune system to be more tolerant of your allergy triggers  - Symptom control: - Start Nasal Steroid Spray: Best results if used daily. - Options include Flonase (fluticasone), Nasocort (triamcinolone), Nasonex (mometasome) 1- 2 sprays in each nostril daily.  - All can be purchased over-the-counter if not covered by insurance. - Start Astelin (azelastine) 1-2 sprays in each nostril twice a day as needed for nasal congestion/itchy nose - Continue Antihistamine: Zyrtec 10mg  daily, can take an extra dose for breakthrough itch  -Options include Zyrtec (Cetirizine) 10mg , Claritin (Loratadine) 10mg , Allegra (Fexofenadine) 180mg , or Xyzal (Levocetirinze) 5mg  - Can be purchased over-the-counter if not covered by insurance.  Allergic Conjunctivitis:  - Start Allergy Eye drops-great options include Pataday (Olopatadine) or Zaditor (ketotifen) for eye symptoms daily as needed-both sold over the counter if not covered by insurance. and Rewetting Drops such as Systane,TheraTears, etc  -Avoid eye drops that say red eye relief as they may contain medications that dry out your eyes.   Follow up: for allergy skin test (1-55 environmental panel).  Will need to hold zyrte cfor 3 days prior   Thank you so much for letting me partake in your care today.  Don't hesitate to reach out if you have any additional concerns!  Ferol Luz, MD  [facility] [place] [place]

## 2023-08-28 NOTE — Progress Notes (Signed)
New Patient Note  RE: [name] MRN: [id number] DOB: June 15, 1956 Date of Office Visit: 08/28/2023  Consult requested by: Laren Boom, DO Primary care provider: Jarrett Soho, PA-C  Chief Complaint: Allergic Rhinitis  (Itchy ears mainly left ear, sneezing, itchy red eyes,and  drainage down her throat )  History of Present Illness: I had the pleasure of seeing Lisa Santos for initial evaluation at the [facility] on 08/28/2023. She is a 67 y.o. female, who is referred here by Jarrett Soho, PA-C for the evaluation of allergic rhinitis.  History obtained from patient, chart review .  Chronic rhinitis: symptoms have on and off from most her life  Symptoms include:  ear itching, post nasal drainage, itchy eyes, and itchy nose; sneezing, and generalized pruritus, denies recurrent ear or sinus infections  Occurs year-round Potential triggers: maybe dust  Treatments tried: Zyrtec (some benefit), ear drops ,doxycyline  Previous allergy testing: no History of reflux/heartburn:  not well controlled, improved control with zyrtec History of chronic sinusitis or sinus surgery:  Adenoidectomy  Nonallergic triggers:  denies      Assessment and Plan: [name] is a 67 y.o. female with: Chronic rhinitis  Other chronic allergic conjunctivitis of both eyes  Dermatographic urticaria   Plan: Patient Instructions  Chronic Rhinitis    : Chronic Pruritus and Dermatographism  - allergy testing today: deferred due to [facility] insurance   - Prevention:  - allergen avoidance when possible - consider allergy shots as long term control of your symptoms by teaching your immune system to be more tolerant of your allergy triggers  - Symptom control: - Start Nasal Steroid Spray: Best results if used daily. - Options include Flonase (fluticasone), Nasocort (triamcinolone), Nasonex (mometasome) 1- 2 sprays in each nostril daily.  - All can be purchased over-the-counter if  not covered by insurance. - Start Astelin (azelastine) 1-2 sprays in each nostril twice a day as needed for nasal congestion/itchy nose - Continue Antihistamine: Zyrtec 10mg  daily, can take an extra dose for breakthrough itch  -Options include Zyrtec (Cetirizine) 10mg , Claritin (Loratadine) 10mg , Allegra (Fexofenadine) 180mg , or Xyzal (Levocetirinze) 5mg  - Can be purchased over-the-counter if not covered by insurance.  Allergic Conjunctivitis:  - Start Allergy Eye drops-great options include Pataday (Olopatadine) or Zaditor (ketotifen) for eye symptoms daily as needed-both sold over the counter if not covered by insurance. and Rewetting Drops such as Systane,TheraTears, etc  -Avoid eye drops that say red eye relief as they may contain medications that dry out your eyes.   Follow up: for allergy skin test (1-55 environmental panel).  Will need to hold zyrte cfor 3 days prior   Thank you so much for letting me partake in your care today.  Don't hesitate to reach out if you have any additional concerns!  Ferol Luz, MD  [facility] [place], [place]   Meds ordered this encounter  Medications   azelastine (ASTELIN) 0.1 % nasal spray    Sig: Place 2 sprays into both nostrils 2 (two) times daily. Use in each nostril as directed    Dispense:  30 mL    Refill:  12   fluticasone (FLONASE) 50 MCG/ACT nasal spray    Sig: Place 2 sprays into both nostrils daily.    Dispense:  16 g    Refill:  2   Olopatadine HCl (PATADAY) 0.2 % SOLN    Sig: Place 1 drop into both eyes 1 day or 1 dose.  Dispense:  2.5 mL    Refill:  5   Lab Orders  No laboratory test(s) ordered today    Other allergy screening: Asthma: no Rhino conjunctivitis: yes Food allergy: no Medication allergy: no Hymenoptera allergy: no Urticaria: no Eczema:no History of recurrent infections suggestive of immunodeficency: no  Diagnostics:  Skin Testing:  Deferred due to [facility] insurance  .   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 Use in each nostril as directed 30 mL 12   doxycycline (VIBRAMYCIN) 100 MG capsule Take 100 mg by mouth daily.     Fluocinolone Acetonide 0.01 % OIL PLACE 4 DROPS IN EAR(S) 2 (TWO) TIMES DAILY AS NEEDED (ITCHING EARS).     fluticasone (FLONASE) 50 MCG/ACT nasal spray Place 2 sprays into both nostrils daily. 16 g 2   Olopatadine HCl (PATADAY) 0.2 % SOLN Place 1 drop into both eyes 1 day or 1 dose. 2.5 mL 5   rosuvastatin (CRESTOR) 10 MG tablet rosuvastatin 10 mg tablet   1 tablet every day by oral route.     pantoprazole (PROTONIX) 40 MG tablet Take 40 mg by mouth daily. (Patient not taking: Reported on 01/28/2022)     No current facility-administered medications for this visit.   Allergies: Allergies  Allergen Reactions   Ibandronic Acid     Other reaction(s): reflux   Oxycodone     Other reaction(s): stomach upset and loopy   Latex Rash   Social History: Social History   Socioeconomic History   Marital status: Married    Spouse name: Not on file   Number of children: Not on file   Years of education: Not on file   Highest education level: Not on file  Occupational History   Not on file  Tobacco Use   Smoking status: Never   Smokeless tobacco: Not on file  Vaping Use   Vaping status: Never Used  Substance and Sexual Activity   Alcohol use: No   Drug use: No    Sexual activity: Not on file  Other Topics Concern   Not on file  Social History Narrative   Not on file   Social Determinants of Health   Financial Resource Strain: Not on file  Food Insecurity: Not on file  Transportation Needs: Not on file  Physical Activity: Not on file  Stress: Not on file  Social Connections: Not on file   Lives in a [facility], no roaches in the house but is to get off floor.  Not exposed to fumes, chemicals or dust.  No dust mite precautions.  No HEPA filter in the home and [place] an interstate or [place]. Smoking: No exposure Occupation: Retired  Landscape architect HistorySurveyor, minerals in the house: no Engineer, civil (consulting) in the family room: no Carpet in the bedroom: no Heating: gas Cooling: central Pet: no  Family History: Family History  Problem Relation Age  of Onset   Breast cancer Neg Hx      ROS: All others negative except as noted per HPI.   Objective: BP 134/80   Pulse 76   Temp 98 F (36.7 C)   Resp 18   Ht 5' 2.6" (1.59 m)   Wt 172 lb 8 oz (78.2 kg)   SpO2 97%   BMI 30.95 kg/m  Body mass index is 30.95 kg/m.  General Appearance:  Alert, cooperative, no distress, appears stated age  Head:  Normocephalic, without obvious abnormality, atraumatic  Eyes:  Conjunctiva clear, EOM's intact  Nose: Nares normal,  Pink edematous mucosa, no rhinnorhea, hypertrophic turbinates, no visible anterior polyps, and septum midline  Throat: Lips, tongue normal; teeth and gums normal, + cobblestoning  Neck: Supple, symmetrical  Lungs:   clear to auscultation bilaterally, Respirations unlabored, no coughing  Heart:  regular rate and rhythm and no murmur, Appears well perfused  Extremities: No edema  Skin: Skin color, texture, turgor normal, no rashes or lesions on visualized portions of skin  Neurologic: No gross deficits   The plan was reviewed with the patient/family, and all questions/concerned were addressed.  It was my pleasure to see  Lisa Santos today and participate in her care. Please feel free to contact me with any questions or concerns.  Sincerely,  Ferol Luz, MD Allergy & Immunology  [facility] office: [phone] [facility] office: [phone]

## 2023-09-11 ENCOUNTER — Ambulatory Visit: Payer: Medicare Other | Admitting: Internal Medicine

## 2023-09-25 ENCOUNTER — Ambulatory Visit: Payer: Medicare Other | Admitting: Internal Medicine

## 2023-10-09 ENCOUNTER — Ambulatory Visit (INDEPENDENT_AMBULATORY_CARE_PROVIDER_SITE_OTHER): Payer: Medicare Other | Admitting: Internal Medicine

## 2023-10-09 DIAGNOSIS — H1013 Acute atopic conjunctivitis, bilateral: Secondary | ICD-10-CM

## 2023-10-09 DIAGNOSIS — J302 Other seasonal allergic rhinitis: Secondary | ICD-10-CM

## 2023-10-09 DIAGNOSIS — J3089 Other allergic rhinitis: Secondary | ICD-10-CM | POA: Diagnosis not present

## 2023-10-09 DIAGNOSIS — H1010 Acute atopic conjunctivitis, unspecified eye: Secondary | ICD-10-CM

## 2023-10-09 NOTE — Progress Notes (Signed)
Date of Service/Encounter:  10/09/23  Allergy testing appointment   Initial visit on 08/28/23, seen for rhinitis.  Please see that note for additional details.  Today reports for allergy diagnostic testing:    DIAGNOSTICS:  Skin Testing: Environmental allergy panel. Adequate positive and negative controls Results discussed with patient/family.  Airborne Adult Perc - 10/09/23 0835     Time Antigen Placed 0835    Allergen Manufacturer Waynette Buttery    Location Back    Number of Test 55    1. Control-Buffer 50% Glycerol Negative    2. Control-Histamine 4+    3. Bahia Negative    4. French Southern Territories Negative    5. Johnson Negative    6. Kentucky Blue 2+    7. Meadow Fescue Negative    8. Perennial Rye Negative    9. Timothy Negative    10. Ragweed Mix Negative    11. Cocklebur 2+    12. Plantain,  English Negative    13. Baccharis Negative    14. Dog Fennel 2+    15. Gu[place] Thistle 2+    16. Lamb's Quarters Negative    17. Sheep Sorrell Negative    18. Rough Pigweed 2+    19. Marsh Elder, Rough Negative    20. Mugwort, Common Negative    21. Box, Elder Negative    22. Cedar, red Negative    23. Sweet Gum Negative    24. Pecan Pollen 2+    25. Pine Mix Negative    26. Walnut, Black Pollen Negative    27. Red Mulberry Negative    28. Ash Mix Negative    29. Birch Mix Negative    30. Beech American Negative    31. Cottonwood, [place] Negative    32. Hickory, White Negative    33. Maple Mix Negative    34. Oak, [place] Mix Negative    35. Sycamore Eastern Negative    36. Alternaria Alternata Negative    37. Cladosporium Herbarum Negative    38. Aspergillus Mix Negative    39. Penicillium Mix 2+    40. Bipolaris Sorokiniana (Helminthosporium) Negative    41. Drechslera Spicifera (Curvularia) Negative    42. Mucor Plumbeus Negative    43. Fusarium Moniliforme 2+    44. Aureobasidium Pullulans (pullulara) 2+    45. Rhizopus Oryzae 2+    46. Botrytis Cinera Negative    47.  Epicoccum Nigrum Negative    48. Phoma Betae Negative    49. Dust Mite Mix Negative    50. Cat Hair 10,000 BAU/ml Negative    51.  Dog Epithelia Negative    52. Mixed Feathers Negative    53. Horse Epithelia Negative    54. Cockroach, German Negative    55. Tobacco Leaf Negative             Intradermal - 10/09/23 0925     Time Antigen Placed 0900    Allergen Manufacturer Waynette Buttery    Location Arm    Number of Test 12    Control Negative    Bahia Negative    French Southern Territories Negative    Johnson Negative    Ragweed Mix Negative    Tree Mix Negative    Mold 1 Negative    Mold 3 Negative    Mite Mix 2+    Cat 2+    Dog 2+    Cockroach 2+             Allergy testing results were read  and interpreted by myself, documented by clinical staff.  Patient provided with copy of allergy testing along with avoidance measures when indicated.  Ferol Luz, MD  [facility] [place]

## 2023-10-09 NOTE — Patient Instructions (Addendum)
Allergic Rhinitis - allergy testing 10/09/23: Grass, weeds, trees, molds, cat, dog, dust mite, roach  - Prevention:  - allergen avoidance when possible - consider allergy shots as long term control of your symptoms by teaching your immune system to be more tolerant of your allergy triggers  - Symptom control: - Continue Nasal Steroid Spray: Best results if used daily. - Options include Flonase (fluticasone), Nasocort (triamcinolone), Nasonex (mometasome) 1- 2 sprays in each nostril daily.  - All can be purchased over-the-counter if not covered by insurance. - Continue Astelin (azelastine) 1-2 sprays in each nostril twice a day as needed for nasal congestion/itchy nose - Continue Antihistamine: Zyrtec 10mg  daily, can take an extra dose for breakthrough itch  -Options include Zyrtec (Cetirizine) 10mg , Claritin (Loratadine) 10mg , Allegra (Fexofenadine) 180mg , or Xyzal (Levocetirinze) 5mg  - Can be purchased over-the-counter if not covered by insurance.  Allergic Conjunctivitis:  - Continue Allergy Eye drops-great options include Pataday (Olopatadine) or Zaditor (ketotifen) for eye symptoms daily as needed-both sold over the counter if not covered by insurance. and Rewetting Drops such as Systane,TheraTears, etc  -Avoid eye drops that say red eye relief as they may contain medications that dry out your eyes.   Follow up: 6 months  Thank you so much for letting me partake in your care today.  Don't hesitate to reach out if you have any additional concerns!  Ferol Luz, MD  [facility] [place], [place]  Reducing Pollen Exposure  The American Academy of Allergy, Asthma and Immunology suggests the following steps to reduce your exposure to pollen during allergy seasons.    Do not hang sheets or clothing out to dry; pollen may collect on these items. Do not mow lawns or spend time around freshly cut grass; mowing stirs up pollen. Keep windows closed at night.  Keep car  windows closed while driving. Minimize morning activities outdoors, a time when pollen counts are usually at their highest. Stay indoors as much as possible when pollen counts or humidity is high and on windy days when pollen tends to remain in the air longer. Use air conditioning when possible.  Many air conditioners have filters that trap the pollen spores. Use a HEPA room air filter to remove pollen form the indoor air you breathe.  Control of Mold Allergen   Mold and fungi can grow on a variety of surfaces provided certain temperature and moisture conditions exist.  Outdoor molds grow on plants, decaying vegetation and soil.  The major outdoor mold, Alternaria and Cladosporium, are found in very high numbers during hot and dry conditions.  Generally, a late Summer - Fall peak is seen for common outdoor fungal spores.  Rain will temporarily lower outdoor mold spore count, but counts rise rapidly when the rainy period ends.  The most important indoor molds are Aspergillus and Penicillium.  Dark, humid and poorly ventilated basements are ideal sites for mold growth.  The next most common sites of mold growth are the bathroom and the kitchen.  Outdoor (Seasonal) Mold Control   Use air conditioning and keep windows closed Avoid exposure to decaying vegetation. Avoid leaf raking. Avoid grain handling. Consider wearing a face mask if working in moldy areas.    Indoor (Perennial) Mold Control    Maintain humidity below 50%. Clean washable surfaces with 5% bleach solution. Remove sources e.g. contaminated carpets.  Control of Dog or Cat Allergen  Avoidance is the best way to manage a dog or cat allergy. If you have a  dog or cat and are allergic to dog or cats, consider removing the dog or cat from the home. If you have a dog or cat but don't want to find it a new home, or if your family wants a pet even though someone in the household is allergic, here are some strategies that may help keep  symptoms at bay:  Keep the pet out of your bedroom and restrict it to only a few rooms. Be advised that keeping the dog or cat in only one room will not limit the allergens to that room. Don't pet, hug or kiss the dog or cat; if you do, wash your hands with soap and water. High-efficiency particulate air (HEPA) cleaners run continuously in a bedroom or living room can reduce allergen levels over time. Regular use of a high-efficiency vacuum cleaner or a central vacuum can reduce allergen levels. Giving your dog or cat a bath at least once a week can reduce airborne allergen.  DUST MITE AVOIDANCE MEASURES:  There are three main measures that need and can be taken to avoid house dust mites:  Reduce accumulation of dust in general -reduce furniture, clothing, carpeting, books, stuffed animals, especially in bedroom  Separate yourself from the dust -use pillow and mattress encasements (can be found at stores such as [employer], [employer] or online) -avoid direct exposure to air condition flow -use a HEPA filter device, especially in the bedroom; you can also use a HEPA filter vacuum cleaner -wipe dust with a moist towel instead of a dry towel or broom when cleaning  Decrease mites and/or their secretions -wash clothing and linen and stuffed animals at highest temperature possible, at least every 2 weeks -stuffed animals can also be placed in a bag and put in a freezer overnight  Despite the above measures, it is impossible to eliminate dust mites or their allergen completely from your home.  With the above measures the burden of mites in your home can be diminished, with the goal of minimizing your allergic symptoms.  Success will be reached only when implementing and using all means together.  Control of Cockroach Allergen  Cockroach allergen has been identified as an important cause of acute attacks of asthma, especially in urban settings.  There are fifty-five species of cockroach that  exist in the [place], however only three, the [place], [place] species produce allergen that can affect patients with Asthma.  Allergens can be obtained from fecal particles, egg casings and secretions from cockroaches.    Remove food sources. Reduce access to water. Seal access and entry points. Spray runways with 0.5-1% Diazinon or Chlorpyrifos Blow boric acid power under stoves and refrigerator. Place bait stations (hydramethylnon) at feeding sites.

## 2023-11-23 ENCOUNTER — Other Ambulatory Visit: Payer: Self-pay | Admitting: Internal Medicine

## 2024-02-23 ENCOUNTER — Other Ambulatory Visit: Payer: Self-pay | Admitting: Family Medicine

## 2024-02-23 DIAGNOSIS — Z1231 Encounter for screening mammogram for malignant neoplasm of breast: Secondary | ICD-10-CM

## 2024-04-07 NOTE — Progress Notes (Signed)
 FOLLOW UP Date of Service/Encounter:  04/11/24  Subjective:  Lisa Santos (DOB: [birth year]) is a 68 y.o. female who [facility] in re-evaluation of the following: Seasonal and perennial allergic rhinitis History obtained from: chart review and patient.  For Review, LV was on 10/09/23  with Dr. Jolayne Natter seen for  allergy  testing. . See below for summary of history and diagnostics.  ----------------------------------------------------- Pertinent History/Diagnostics:  Allergic Rhinitis:  symptoms have on and off from most her life  Symptoms include:  ear itching, post nasal drainage, itchy eyes, and itchy nose; sneezing, and generalized pruritus, denies recurrent ear or sinus infections  Occurs year-round. No GERD. Hx of adenoidectomy. - SPT environmental panel (10/09/23): positive to Grass, weeds, trees, molds, cat, dog, dust mite, roach  --------------------------------------------------- Today presents for follow-up. Discussed the use of AI scribe software for clinical note transcription with the patient, who gave verbal consent to proceed.  History of Present Illness   Lisa CARDY "Ruffin Cotton" is a 68 year old female who presents for management of allergic symptoms.  She has reduced her Xyzal  dose to half a pill due to excessive drowsiness, which persists into the next day. Despite this adjustment, she continues to experience significant sleepiness. She has discontinued azelastine  due to dryness.  She continues to use Flonase , which she finds effective. However, she still experiences symptoms such as a sensation of something in her eyes. She uses Pataday  for her eyes but reports minimal relief. She has not tried other antihistamines like Allegra or Zyrtec.  She describes an incident where exposure to wood dust at a store led to two days of nasal congestion and coughing. She notes that her nasal passages feel swollen, which she attributes  to seasonal changes.   She is not able to commit to allergy  injections at this time.      All medications reviewed by clinical staff and updated in chart. No new pertinent medical or surgical history except as noted in HPI.  ROS: All others negative except as noted per HPI.   Objective:  Pulse 69   Temp 97.7 F (36.5 C) (Temporal)   Resp 18   Ht 5' 2.21" (1.58 m)   Wt 182 lb 6.4 oz (82.7 kg)   SpO2 100%   BMI 33.14 kg/m  Body mass index is 33.14 kg/m. Physical Exam: General Appearance:  Alert, cooperative, no distress, appears stated age  Head:  Normocephalic, without obvious abnormality, atraumatic  Eyes:  Conjunctiva clear, EOM's intact  Ears EACs normal bilaterally and normal TMs bilaterally  Nose: Nares normal, hypertrophic turbinates, normal mucosa, and no visible anterior polyps  Throat: Lips, tongue normal; teeth and gums normal, normal posterior oropharynx  Neck: Supple, symmetrical  Lungs:   clear to auscultation bilaterally, Respirations unlabored, no coughing  Heart:  regular rate and rhythm and no murmur, Appears well perfused  Extremities: No edema  Skin: Skin color, texture, turgor normal and no rashes or lesions on visualized portions of skin  Neurologic: No gross deficits   Labs:  Lab Orders  No laboratory test(s) ordered today   Assessment/Plan   Allergic Rhinitis-not at goal - allergy  testing 10/09/23: Grass, weeds, trees, molds, cat, dog, dust mite, roach  - Prevention:  - allergen avoidance when possible - consider allergy  shots as long term control of your symptoms by teaching your immune system to be more tolerant of your allergy  triggers  - Symptom control: - use saline spray as needed prior to  medicated nasal spray  - Continue Nasal Steroid Spray: Best results if used daily. - Options include Flonase  (fluticasone ), Nasocort (triamcinolone ), Nasonex (mometasome) 1- 2 sprays in each nostril daily.  - All can be purchased over-the-counter if  not covered by insurance. - Continue Astelin  (azelastine ) 1-2 sprays in each nostril twice a day as needed for nasal congestion/itchy nose - Continue Antihistamine: Zyrtec 10mg  daily, can take an extra dose for breakthrough itch  -Options include Zyrtec (Cetirizine) 10mg , Claritin (Loratadine) 10mg , Allegra (Fexofenadine) 180mg , or Xyzal  (Levocetirinze) 2.5mg  - Can be purchased over-the-counter if not covered by insurance.  Allergic Conjunctivitis: not at goal - Continue Allergy  Eye drops-great options include Pataday  (Olopatadine ) or Zaditor (ketotifen) for eye symptoms daily as needed-both sold over the counter if not covered by insurance. and Rewetting Drops such as Systane,TheraTears, etc  Send bepreve which can also be used 1 drop twice a day as needed which may be covered by your insurance OR Buy Pataday  Extra Strength 0.7% 1 drop daily as needed. -Avoid eye drops that say red eye relief as they may contain medications that dry out your eyes.   Follow up: 12 months  Thank you so much for letting me partake in your care today.  Don't hesitate to reach out if you have any additional concerns!  Jonathon Neighbors, MD [facility]  Other: none  Jonathon Neighbors, MD  [facility] [place] 

## 2024-04-11 ENCOUNTER — Encounter: Payer: Self-pay | Admitting: Internal Medicine

## 2024-04-11 ENCOUNTER — Ambulatory Visit (INDEPENDENT_AMBULATORY_CARE_PROVIDER_SITE_OTHER): Admitting: Internal Medicine

## 2024-04-11 ENCOUNTER — Other Ambulatory Visit: Payer: Self-pay

## 2024-04-11 ENCOUNTER — Ambulatory Visit: Payer: Medicare Other | Admitting: Allergy

## 2024-04-11 ENCOUNTER — Ambulatory Visit
Admission: RE | Admit: 2024-04-11 | Discharge: 2024-04-11 | Disposition: A | Discharge status: Home / Self Care | Source: Ambulatory Visit | Attending: Family Medicine | Admitting: Family Medicine

## 2024-04-11 VITALS — HR 69 | Temp 97.7°F | Resp 18 | Ht 62.21 in | Wt 182.4 lb

## 2024-04-11 DIAGNOSIS — Z1231 Encounter for screening mammogram for malignant neoplasm of breast: Secondary | ICD-10-CM

## 2024-04-11 DIAGNOSIS — J3089 Other allergic rhinitis: Secondary | ICD-10-CM | POA: Diagnosis not present

## 2024-04-11 DIAGNOSIS — E785 Hyperlipidemia, unspecified: Secondary | ICD-10-CM | POA: Insufficient documentation

## 2024-04-11 DIAGNOSIS — H1013 Acute atopic conjunctivitis, bilateral: Secondary | ICD-10-CM | POA: Diagnosis not present

## 2024-04-11 DIAGNOSIS — H1010 Acute atopic conjunctivitis, unspecified eye: Secondary | ICD-10-CM | POA: Insufficient documentation

## 2024-04-11 DIAGNOSIS — J302 Other seasonal allergic rhinitis: Secondary | ICD-10-CM

## 2024-04-11 MED ORDER — BEPOTASTINE BESILATE 1.5 % OP SOLN: 1.0000 [drp] | Freq: Two times a day (BID) | OPHTHALMIC | 5 refills | Status: AC | PRN

## 2024-04-11 NOTE — Patient Instructions (Addendum)
 Allergic Rhinitis - allergy  testing 10/09/23: Grass, weeds, trees, molds, cat, dog, dust mite, roach  - Prevention:  - allergen avoidance when possible - consider allergy  shots as long term control of your symptoms by teaching your immune system to be more tolerant of your allergy  triggers  - Symptom control: - use saline spray as needed prior to medicated nasal spray  - Continue Nasal Steroid Spray: Best results if used daily. - Options include Flonase  (fluticasone ), Nasocort (triamcinolone ), Nasonex (mometasome) 1- 2 sprays in each nostril daily.  - All can be purchased over-the-counter if not covered by insurance. - Continue Astelin  (azelastine ) 1-2 sprays in each nostril twice a day as needed for nasal congestion/itchy nose - Continue Antihistamine: Zyrtec 10mg  daily, can take an extra dose for breakthrough itch  -Options include Zyrtec (Cetirizine) 10mg , Claritin (Loratadine) 10mg , Allegra (Fexofenadine) 180mg , or Xyzal  (Levocetirinze) 2.5mg  - Can be purchased over-the-counter if not covered by insurance.  Allergic Conjunctivitis:  - Continue Allergy  Eye drops-great options include Pataday  (Olopatadine ) or Zaditor (ketotifen) for eye symptoms daily as needed-both sold over the counter if not covered by insurance. and Rewetting Drops such as Systane,TheraTears, etc  Send bepreve which can also be used 1 drop twice a day as needed which may be covered by your insurance OR Buy Pataday  Extra Strength 0.7% 1 drop daily as needed. -Avoid eye drops that say red eye relief as they may contain medications that dry out your eyes.   Follow up: 12 months  Thank you so much for letting me partake in your care today.  Don't hesitate to reach out if you have any additional concerns!  Jonathon Neighbors, MD [facility]   Reducing Pollen Exposure  The American Academy of Allergy , Asthma and Immunology suggests the following steps to reduce your exposure to pollen during allergy   seasons.    Do not hang sheets or clothing out to dry; pollen may collect on these items. Do not mow lawns or spend time around freshly cut grass; mowing stirs up pollen. Keep windows closed at night.  Keep car windows closed while driving. Minimize morning activities outdoors, a time when pollen counts are usually at their highest. Stay indoors as much as possible when pollen counts or humidity is high and on windy days when pollen tends to remain in the air longer. Use air conditioning when possible.  Many air conditioners have filters that trap the pollen spores. Use a HEPA room air filter to remove pollen form the indoor air you breathe.  Control of Mold Allergen   Mold and fungi can grow on a variety of surfaces provided certain temperature and moisture conditions exist.  Outdoor molds grow on plants, decaying vegetation and soil.  The major outdoor mold, Alternaria and Cladosporium, are found in very high numbers during hot and dry conditions.  Generally, a late Summer - Fall peak is seen for common outdoor fungal spores.  Rain will temporarily lower outdoor mold spore count, but counts rise rapidly when the rainy period ends.  The most important indoor molds are Aspergillus and Penicillium.  Dark, humid and poorly ventilated basements are ideal sites for mold growth.  The next most common sites of mold growth are the bathroom and the kitchen.  Outdoor (Seasonal) Mold Control   Use air conditioning and keep windows closed Avoid exposure to decaying vegetation. Avoid leaf raking. Avoid grain handling. Consider wearing a face mask if working in moldy areas.    Indoor (Perennial) Mold Control  Maintain humidity below 50%. Clean washable surfaces with 5% bleach solution. Remove sources e.g. contaminated carpets.  Control of Dog or Cat Allergen  Avoidance is the best way to manage a dog or cat allergy . If you have a dog or cat and are allergic to dog or cats, consider removing the  dog or cat from the home. If you have a dog or cat but don't want to find it a new home, or if your family wants a pet even though someone in the household is allergic, here are some strategies that may help keep symptoms at bay:  Keep the pet out of your bedroom and restrict it to only a few rooms. Be advised that keeping the dog or cat in only one room will not limit the allergens to that room. Don't pet, hug or kiss the dog or cat; if you do, wash your hands with soap and water. High-efficiency particulate air (HEPA) cleaners run continuously in a bedroom or living room can reduce allergen levels over time. Regular use of a high-efficiency vacuum cleaner or a central vacuum can reduce allergen levels. Giving your dog or cat a bath at least once a week can reduce airborne allergen.  DUST MITE AVOIDANCE MEASURES:  There are three main measures that need and can be taken to avoid house dust mites:  Reduce accumulation of dust in general -reduce furniture, clothing, carpeting, books, stuffed animals, especially in bedroom  Separate yourself from the dust -use pillow and mattress encasements (can be found at stores such as [employer] or online) -avoid direct exposure to air condition flow -use a HEPA filter device, especially in the bedroom; you can also use a HEPA filter vacuum cleaner -wipe dust with a moist towel instead of a dry towel or broom when cleaning  Decrease mites and/or their secretions -wash clothing and linen and stuffed animals at highest temperature possible, at least every 2 weeks -stuffed animals can also be placed in a bag and put in a freezer overnight  Despite the above measures, it is impossible to eliminate dust mites or their allergen completely from your home.  With the above measures the burden of mites in your home can be diminished, with the goal of minimizing your allergic symptoms.  Success will be reached only when implementing and using all means  together.  Control of Cockroach Allergen  Cockroach allergen has been identified as an important cause of acute attacks of asthma, especially in urban settings.  There are fifty-five species of cockroach that exist in the [place] , however only three, the [place], [place] and [place] species produce allergen that can affect patients with Asthma.  Allergens can be obtained from fecal particles, egg casings and secretions from cockroaches.    Remove food sources. Reduce access to water. Seal access and entry points. Spray runways with 0.5-1% Diazinon or Chlorpyrifos Blow boric acid power under stoves and refrigerator. Place bait stations (hydramethylnon) at feeding sites.

## 2024-04-13 ENCOUNTER — Ambulatory Visit (INDEPENDENT_AMBULATORY_CARE_PROVIDER_SITE_OTHER): Admitting: Podiatrist

## 2024-04-13 ENCOUNTER — Encounter: Payer: Self-pay | Admitting: Podiatrist

## 2024-04-13 ENCOUNTER — Ambulatory Visit (INDEPENDENT_AMBULATORY_CARE_PROVIDER_SITE_OTHER)

## 2024-04-13 DIAGNOSIS — S93602A Unspecified sprain of left foot, initial encounter: Secondary | ICD-10-CM | POA: Diagnosis not present

## 2024-04-13 DIAGNOSIS — S9032XA Contusion of left foot, initial encounter: Secondary | ICD-10-CM

## 2024-04-13 NOTE — Patient Instructions (Signed)
 Ankle Sprain  An ankle sprain is a stretch or tear in a ligament in the ankle. Ligaments are tissues that connect bones to each other. The two most common types of ankle sprains are: Inversion sprain. This happens when the foot turns inward and the ankle rolls outward. It affects the ligament on the outside of the foot (lateral ligament). Eversion sprain. This happens when the foot turns outward and the ankle rolls inward. It affects the ligament on the inner side of the foot (medial ligament). What are the causes? An ankle sprain is often caused by rolling or twisting the ankle by accident. What increases the risk? You are more likely to get an ankle sprain if you play sports. What are the signs or symptoms? Symptoms of an ankle sprain include: Pain in your ankle. Swelling. Bruising. Bruises may form right after you sprain your ankle or 1-2 days later. Trouble standing or walking. This includes trouble turning or changing directions. How is this diagnosed? An ankle sprain is diagnosed with a physical exam. Your health care provider will press on parts of your foot and ankle and try to move them in certain ways. You may also have X-rays taken. These may be done to see how severe the sprain is and to check for broken bones. How is this treated? An ankle sprain may be treated with: A brace or splint. This is used to keep the ankle from moving until it heals. An elastic bandage (dressing). This is used to support the ankle. Crutches. Pain medicine. Surgery. This may be needed if the sprain is severe. Physical therapy. This may help to improve the range of motion in the ankle. Follow these instructions at home: If you have a removable brace or a splint: Wear the brace or splint as told by your provider. Remove it only as told by your provider. Check the skin around the brace or splint every day. Tell your provider about any concerns. Loosen the brace or splint if your toes tingle, become  numb, or turn cold and blue. Keep the brace or splint clean. If the brace or splint is not waterproof: Do not let it get wet. Cover it with a watertight covering when you take a bath or a shower. If you have an elastic dressing: Take the dressing off to shower or bathe. If the dressing feels too tight, adjust it to make it more comfortable. Loosen the dressing if your foot tingles, becomes numb, or turns cold and blue. Managing pain, stiffness, and swelling If told, put ice on the affected area. If you have a removable brace or splint, remove it as told by your provider. Put ice in a plastic bag. Place a towel between your skin and the bag. Leave the ice on for 20 minutes, 2-3 times a day. Remove the ice if your skin turns bright red. This is very important. If you cannot feel pain, heat, or cold, you have a greater risk of damage to the area. If your skin turns bright red, remove the ice right away to prevent skin damage. The risk of damage is higher if you cannot feel pain, heat, or cold. Move your toes often to reduce stiffness and swelling. For 2-3 days, raise (elevate) your ankle above the level of your heart while you are sitting or lying down. General instructions Take over-the-counter and prescription medicines only as told by your provider. Do not use any products that contain nicotine or tobacco. These products include cigarettes, chewing tobacco, and  vaping devices, such as e-cigarettes. If you need help quitting, ask your provider. Rest your ankle. Do not use your ankle to support your body weight until your provider says that you can. Use crutches as told by your provider. Ask your provider when it is safe to drive if you have a brace or splint on your ankle. Contact a health care provider if: You have bruising or swelling that get worse all of a sudden. Your pain does not get better with medicine. Get help right away if: Your foot or toes become numb or blue. You have  severe pain that gets worse. This information is not intended to replace advice given to you by your health care provider. Make sure you discuss any questions you have with your health care provider. Document Revised: 09/10/2022 Document Reviewed: 09/10/2022 Elsevier Patient Education  2024 [employer].

## 2024-04-13 NOTE — Progress Notes (Signed)
 Chief Complaint  Patient presents with   Foot Injury    DOI: 03/03/24 - Dorsal midfoot/lateral side left - fell and now foot has been hurting a lot, some swelling, previous foot surgery on left, tried tylenol  arthritis for pain   New Patient (Initial Visit)    Est pt 10/2021     HPI: Patient is 68 y.o. female who presents today for concern of left foot dorsal pain.  She had a fall in march and relates she started having foot pain soon after.  States it is painful on the top of the midfoot and lateral left foot.  She has tried tylenol  for pain.   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 10 mL 5   doxycycline (VIBRAMYCIN) 100 MG capsule Take 100 mg by mouth daily.     Fluocinolone Acetonide 0.01 % OIL PLACE 4 DROPS IN EAR(S) 2 (TWO) TIMES DAILY AS NEEDED (ITCHING EARS).     fluticasone  (FLONASE ) 50 MCG/ACT nasal spray SPRAY 2 SPRAYS INTO EACH NOSTRIL EVERY DAY 48 mL 1   levocetirizine (XYZAL ) 5 MG tablet Take 1 tablet (5 mg total) by mouth every evening. 30 tablet 5   Olopatadine  HCl (PATADAY ) 0.2 % SOLN Place 1 drop into both eyes 1 day or 1 dose. 2.5 mL 5   rosuvastatin (CRESTOR) 10 MG tablet rosuvastatin 10 mg tablet   1 tablet every day by oral route.     No current facility-administered medications on file prior to visit.    Allergies  Allergen Reactions   Ibandronate     Other reaction(s): reflux   Oxycodone      Other reaction(s): stomach  upset and loopy   Latex Rash    Review of Systems No fevers, chills, nausea, muscle aches, no difficulty breathing, no calf pain, no chest pain or shortness of breath.   Physical Exam  GENERAL APPEARANCE: Alert, conversant. Appropriately groomed. No acute distress.   VASCULAR: Pedal pulses palpable 2/4 DP and 2/4 PT bilateral.  Capillary refill time is immediate to all digits,  Proximal to distal cooling is warm to warm.  Digital perfusion adequate.   NEUROLOGIC: sensation is intact to 5.07 monofilament at 5/5 sites bilateral.  Light touch is intact bilateral, vibratory sensation intact bilateral  MUSCULOSKELETAL: acceptable muscle strength, tone and stability bilateral.  No gross boney pedal deformities noted.  Pain on dorsal midfoot noted left. Some tenderness at sinus tarsi left, as well as fifth metatarsal base left.    DERMATOLOGIC: skin is warm, supple, and dry.  Color, texture, and turgor of skin within normal limits.   Radiographic exam left foot:  Normal osseous mineralization.  No fracture or dislocation or acute osseous abnormalities present.  Joint spaces are normal. Pin fixation in good position left first metatarsal.  Normal xray exam      Assessment     ICD-10-CM   1. Contusion of left foot, initial encounter  S90.32XA DG Foot Complete Left  2. Sprain of left foot, initial encounter  Q65.784O        Plan  Discussed xray and exam findings.  Discussed she likely has a soft tissue sprain of the left foot.  Recommended an ankle brace be worn for the next 4 weeks then wean out.  Also recommended voltaren gel as well as advil / aleve for pain.  If symptoms fail to resolve in the next couple months she will call.

## 2025-02-06 ENCOUNTER — Ambulatory Visit: Admitting: Family Medicine

## 2025-02-07 ENCOUNTER — Ambulatory Visit: Admitting: Family Medicine

## 2025-04-03 ENCOUNTER — Ambulatory Visit: Admitting: Internal Medicine
